# Patient Record
Sex: Female | Born: 1970 | Hispanic: Yes | Marital: Married | Smoking: Never smoker
Health system: Southern US, Community
[De-identification: ages and names within clinical notes are randomized; demographics above are authoritative.]

## PROBLEM LIST (undated history)

## (undated) DIAGNOSIS — D649 Anemia, unspecified: Secondary | ICD-10-CM

## (undated) DIAGNOSIS — O909 Complication of the puerperium, unspecified: Secondary | ICD-10-CM

## (undated) DIAGNOSIS — D259 Leiomyoma of uterus, unspecified: Secondary | ICD-10-CM

## (undated) DIAGNOSIS — R519 Headache, unspecified: Secondary | ICD-10-CM

## (undated) HISTORY — DX: Leiomyoma of uterus, unspecified: D25.9

---
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
## 2008-04-08 DIAGNOSIS — O909 Complication of the puerperium, unspecified: Secondary | ICD-10-CM

## 2008-04-08 HISTORY — DX: Complication of the puerperium, unspecified: O90.9

## 2019-01-01 ENCOUNTER — Telehealth: Payer: Self-pay | Admitting: *Deleted

## 2019-01-01 NOTE — Telephone Encounter (Signed)
Called and left the patient a message to call the office back. Need to schedule the patient for a follow up appt

## 2019-01-04 ENCOUNTER — Telehealth: Payer: Self-pay | Admitting: *Deleted

## 2019-01-04 NOTE — Telephone Encounter (Signed)
Called and left the patient a message to call the office back. Patient needs to be scheduled for a new patient appt  °

## 2019-01-05 ENCOUNTER — Telehealth: Payer: Self-pay | Admitting: *Deleted

## 2019-01-05 NOTE — Telephone Encounter (Signed)
Called and left the patient a message to call the office back. Need to schedule the patient for a new patient appt  

## 2019-01-05 NOTE — Telephone Encounter (Signed)
Patient called back and scheduled appt 

## 2019-01-22 ENCOUNTER — Ambulatory Visit: Payer: Self-pay | Admitting: Gynecologic Oncology

## 2019-01-26 ENCOUNTER — Other Ambulatory Visit: Payer: Self-pay | Admitting: Gynecologic Oncology

## 2019-01-26 ENCOUNTER — Encounter: Payer: Self-pay | Admitting: Gynecologic Oncology

## 2019-01-26 ENCOUNTER — Inpatient Hospital Stay: Payer: PRIVATE HEALTH INSURANCE | Attending: Gynecologic Oncology | Admitting: Gynecologic Oncology

## 2019-01-26 ENCOUNTER — Other Ambulatory Visit: Payer: Self-pay

## 2019-01-26 VITALS — BP 124/88 | HR 77 | Temp 98.7°F | Resp 18 | Ht 62.0 in | Wt 133.0 lb

## 2019-01-26 DIAGNOSIS — R971 Elevated cancer antigen 125 [CA 125]: Secondary | ICD-10-CM | POA: Diagnosis not present

## 2019-01-26 DIAGNOSIS — D259 Leiomyoma of uterus, unspecified: Secondary | ICD-10-CM | POA: Insufficient documentation

## 2019-01-26 DIAGNOSIS — N921 Excessive and frequent menstruation with irregular cycle: Secondary | ICD-10-CM | POA: Insufficient documentation

## 2019-01-26 DIAGNOSIS — N83201 Unspecified ovarian cyst, right side: Secondary | ICD-10-CM

## 2019-01-26 DIAGNOSIS — D219 Benign neoplasm of connective and other soft tissue, unspecified: Secondary | ICD-10-CM | POA: Insufficient documentation

## 2019-01-26 MED ORDER — IBUPROFEN 800 MG PO TABS: 800.0000 mg | ORAL_TABLET | Freq: Three times a day (TID) | ORAL | 0 refills | Status: DC | PRN

## 2019-01-26 MED ORDER — SENNOSIDES-DOCUSATE SODIUM 8.6-50 MG PO TABS: 2.0000 | ORAL_TABLET | Freq: Every day | ORAL | 1 refills | Status: DC

## 2019-01-26 MED ORDER — OXYCODONE HCL 5 MG PO TABS: 5.0000 mg | ORAL_TABLET | ORAL | 0 refills | Status: DC | PRN

## 2019-01-26 NOTE — H&P (View-Only) (Signed)
Consult Note: Gyn-Onc  Consult was requested by Dr. Ashok Croon for the evaluation of Tiffany Lambert 48 y.o. female  CC:  Chief Complaint  Patient presents with  . Right ovarian cyst  . Fibroids    Assessment/Plan:  Tiffany Lambert  is a 48 y.o.  year old with a symptomatic fibroid uterus, abnormal uterine bleeding, right ovarian cysts and elevated CA 125.   I discussed with the patient that I am recommending surgery with a robotic assisted total hysterectomy and right salpingo-oophorectomy.  Frozen section will be performed if malignancy is identified a left salpingo-oophorectomy and lymphadenectomy with staging will be performed as deemed appropriate based on pathologic findings.  If pathology is benign we will attempt to preserve her left ovary given her premenopausal age.  Before surgery we will obtain a CT scan of the abdomen and pelvis to evaluate for occult extraovarian disease which would potentially necessitate a laparotomy approach for debulking.  I counseled the patient regarding the risks of surgery including  bleeding, infection, damage to internal organs (such as bladder,ureters, bowels), blood clot, reoperation and rehospitalization. I discussed anticipated postoperative recovery and hospital stay.  I feel that she is a good candidate for outpatient surgery.  HPI: Tiffany Lambert is a 48 year old P2 who is seen in consultation at the request of Dr Ashok Croon for evaluation of abnormal uterine bleeding, fibroid uterus, right ovarian cyst, elevated Ca1 25.  The patient reported an episode of abnormal uterine bleeding in 2017 when she was living in [place].  She was seen by healthcare provider at that time was started on oral contraceptive pills which helped her bleeding and she remained without issues until June 2020.  Between June and September 2020 she developed heavy menorrhagia with mental menorrhagia.  She did not require blood transfusion.  The patient was seen and evaluated with a  transvaginal ultrasound scan on December 16, 2018 which revealed a uterus measuring 8.8 x 9.6 x 7.4 cm with multiple small fibroids and findings consistent with adenomyosis and an endometrial thickness of 4.4 mm.  The left ovary was normal and measured 3 x 2 x 2 cm the right ovary measured 5.4 x 6.6 x 5.2 cm and contained 2 simple cyst with color noted.  There is a small amount of free fluid in the cul-de-sac.  A Ca1 25 was drawn as part of a Roma score on December 18, 2018.  The Ca1 25 was elevated at 464.  The HD4 was within normal limits at 45.4.  The premenopausal Roma score was normal at 0.73 which was considered low likelihood of finding malignancy on surgery.  An endometrial biopsy was performed on December 28, 2018 which revealed benign atrophic endometrium and abundant blood.  The patient was started on progesterone which stopped her abnormal bleeding.  The patient is otherwise a healthy woman.  She has had 1 prior vaginal delivery 1 prior cesarean section.  Her only prior abdominal surgery was a cesarean section.  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  She has no family history for malignancy.  She emigrated from [place].  Current Meds:  Outpatient Encounter Medications as of 01/26/2019  Medication Sig  . ferrous sulfate (FEROSUL) 325 (65 FE) MG tablet Take 325 mg by mouth daily with breakfast.  . Multiple Vitamin (MULTIVITAMIN) tablet Take 1 tablet by mouth daily.  Marland Kitchen omega-3 acid ethyl esters (LOVAZA) 1 g capsule Take by mouth 2 (two) times daily.  Marland Kitchen  Turmeric (QC TUMERIC COMPLEX PO) Take by mouth.  . [DISCONTINUED] medroxyPROGESTERone (PROVERA) 10 MG tablet Take 10 mg by mouth daily.  . [DISCONTINUED] norgestimate-ethinyl estradiol (ORTHO-CYCLEN) 0.25-35 MG-MCG tablet Take 1 tablet by mouth daily.   No facility-administered encounter medications on file as of 01/26/2019.     Allergy:  Allergies  Allergen Reactions  . Other Dermatitis     Dairy products    Social Hx:   Social History   Socioeconomic History  . Marital status: Married    Spouse name: Not on file  . Number of children: Not on file  . Years of education: Not on file  . Highest education level: Not on file  Occupational History  . Not on file  Social Needs  . Financial resource strain: Not on file  . Food insecurity    Worry: Not on file    Inability: Not on file  . Transportation needs    Medical: Not on file    Non-medical: Not on file  Tobacco Use  . Smoking status: Never Smoker  . Smokeless tobacco: Never Used  Substance and Sexual Activity  . Alcohol use: Not on file  . Drug use: Not on file  . Sexual activity: Yes  Lifestyle  . Physical activity    Days per week: Not on file    Minutes per session: Not on file  . Stress: Not on file  Relationships  . Social Herbalist on phone: Not on file    Gets together: Not on file    Attends religious service: Not on file    Active member of club or organization: Not on file    Attends meetings of clubs or organizations: Not on file    Relationship status: Not on file  . Intimate partner violence    Fear of current or ex partner: Not on file    Emotionally abused: Not on file    Physically abused: Not on file    Forced sexual activity: Not on file  Other Topics Concern  . Not on file  Social History Narrative  . Not on file    Past Surgical Hx: History reviewed. No pertinent surgical history.  Past Medical Hx:  Past Medical History:  Diagnosis Date  . Cesarean section wound complication 99991111  . Leiomyoma of uterus     Past Gynecological History:  See HPI No LMP recorded.  Family Hx: History reviewed. No pertinent family history.  Review of Systems:  Constitutional  Feels well,    ENT Normal appearing ears and nares bilaterally Skin/Breast  No rash, sores, jaundice, itching, dryness Cardiovascular  No chest pain, shortness of breath, or edema  Pulmonary   No cough or wheeze.  Gastro Intestinal  No nausea, vomitting, or diarrhoea. No bright red blood per rectum, no abdominal pain, change in bowel movement, or constipation.  Genito Urinary  No frequency, urgency, dysuria, + abnormal bleeding Musculo Skeletal  No myalgia, arthralgia, joint swelling or pain  Neurologic  No weakness, numbness, change in gait,  Psychology  No depression, anxiety, insomnia.   Vitals:  Blood pressure 124/88, pulse 77, temperature 98.7 F (37.1 C), temperature source Temporal, resp. rate 18, height 5\' 2"  (1.575 m), weight 133 lb (60.3 kg), SpO2 100 %.  Physical Exam: WD in NAD Neck  Supple NROM, without any enlargements.  Lymph Node Survey No cervical supraclavicular or inguinal adenopathy Cardiovascular  Pulse normal rate, regularity and rhythm. S1 and S2 normal.  Lungs  Clear to auscultation bilateraly, without wheezes/crackles/rhonchi. Good air movement.  Skin  No rash/lesions/breakdown  Psychiatry  Alert and oriented to person, place, and time  Abdomen  Normoactive bowel sounds, abdomen soft, non-tender and thin without evidence of hernia.  Back No CVA tenderness Genito Urinary  Vulva/vagina: Normal external female genitalia.  No lesions. No discharge or bleeding.  Bladder/urethra:  No lesions or masses, well supported bladder  Vagina: normal  Cervix: Normal appearing, no lesions.  Uterus: retroverted, 12cm, mobile, no parametrial involvement or nodularity. Deviated towards left.  Adnexa: no discretely palpable masses. Rectal  Good tone, no masses no cul de sac nodularity.  Extremities  No bilateral cyanosis, clubbing or edema.   [name], MD  01/26/2019, 12:40 PM  Dd: Dr Rich Number, MD

## 2019-01-26 NOTE — Addendum Note (Signed)
Addended by: [name] D on: 01/26/2019 04:50 PM   Modules accepted: Orders

## 2019-01-26 NOTE — Progress Notes (Signed)
Consult Note: Gyn-Onc  Consult was requested by Dr. Ashok Croon for the evaluation of Tiffany Lambert 48 y.o. female  CC:  Chief Complaint  Patient presents with  . Right ovarian cyst  . Fibroids    Assessment/Plan:  Ms. Phylis Schultheiss  is a 48 y.o.  year old with a symptomatic fibroid uterus, abnormal uterine bleeding, right ovarian cysts and elevated CA 125.   I discussed with the patient that I am recommending surgery with a robotic assisted total hysterectomy and right salpingo-oophorectomy.  Frozen section will be performed if malignancy is identified a left salpingo-oophorectomy and lymphadenectomy with staging will be performed as deemed appropriate based on pathologic findings.  If pathology is benign we will attempt to preserve her left ovary given her premenopausal age.  Before surgery we will obtain a CT scan of the abdomen and pelvis to evaluate for occult extraovarian disease which would potentially necessitate a laparotomy approach for debulking.  I counseled the patient regarding the risks of surgery including  bleeding, infection, damage to internal organs (such as bladder,ureters, bowels), blood clot, reoperation and rehospitalization. I discussed anticipated postoperative recovery and hospital stay.  I feel that she is a good candidate for outpatient surgery.  HPI: Ms Schoenecker is a 48 year old P2 who is seen in consultation at the request of Dr Ashok Croon for evaluation of abnormal uterine bleeding, fibroid uterus, right ovarian cyst, elevated Ca1 25.  The patient reported an episode of abnormal uterine bleeding in 2017 when she was living in [place].  She was seen by healthcare provider at that time was started on oral contraceptive pills which helped her bleeding and she remained without issues until June 2020.  Between June and September 2020 she developed heavy menorrhagia with mental menorrhagia.  She did not require blood transfusion.  The patient was seen and evaluated with a  transvaginal ultrasound scan on December 16, 2018 which revealed a uterus measuring 8.8 x 9.6 x 7.4 cm with multiple small fibroids and findings consistent with adenomyosis and an endometrial thickness of 4.4 mm.  The left ovary was normal and measured 3 x 2 x 2 cm the right ovary measured 5.4 x 6.6 x 5.2 cm and contained 2 simple cyst with color noted.  There is a small amount of free fluid in the cul-de-sac.  A Ca1 25 was drawn as part of a Roma score on December 18, 2018.  The Ca1 25 was elevated at 464.  The HD4 was within normal limits at 45.4.  The premenopausal Roma score was normal at 0.73 which was considered low likelihood of finding malignancy on surgery.  An endometrial biopsy was performed on December 28, 2018 which revealed benign atrophic endometrium and abundant blood.  The patient was started on progesterone which stopped her abnormal bleeding.  The patient is otherwise a healthy woman.  She has had 1 prior vaginal delivery 1 prior cesarean section.  Her only prior abdominal surgery was a cesarean section.  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  She has no family history for malignancy.  She emigrated from [place].  Current Meds:  Outpatient Encounter Medications as of 01/26/2019  Medication Sig  . ferrous sulfate (FEROSUL) 325 (65 FE) MG tablet Take 325 mg by mouth daily with breakfast.  . Multiple Vitamin (MULTIVITAMIN) tablet Take 1 tablet by mouth daily.  Marland Kitchen omega-3 acid ethyl esters (LOVAZA) 1 g capsule Take by mouth 2 (two) times daily.  Marland Kitchen  Turmeric (QC TUMERIC COMPLEX PO) Take by mouth.  . [DISCONTINUED] medroxyPROGESTERone (PROVERA) 10 MG tablet Take 10 mg by mouth daily.  . [DISCONTINUED] norgestimate-ethinyl estradiol (ORTHO-CYCLEN) 0.25-35 MG-MCG tablet Take 1 tablet by mouth daily.   No facility-administered encounter medications on file as of 01/26/2019.     Allergy:  Allergies  Allergen Reactions  . Other Dermatitis     Dairy products    Social Hx:   Social History   Socioeconomic History  . Marital status: Married    Spouse name: Not on file  . Number of children: Not on file  . Years of education: Not on file  . Highest education level: Not on file  Occupational History  . Not on file  Social Needs  . Financial resource strain: Not on file  . Food insecurity    Worry: Not on file    Inability: Not on file  . Transportation needs    Medical: Not on file    Non-medical: Not on file  Tobacco Use  . Smoking status: Never Smoker  . Smokeless tobacco: Never Used  Substance and Sexual Activity  . Alcohol use: Not on file  . Drug use: Not on file  . Sexual activity: Yes  Lifestyle  . Physical activity    Days per week: Not on file    Minutes per session: Not on file  . Stress: Not on file  Relationships  . Social Herbalist on phone: Not on file    Gets together: Not on file    Attends religious service: Not on file    Active member of club or organization: Not on file    Attends meetings of clubs or organizations: Not on file    Relationship status: Not on file  . Intimate partner violence    Fear of current or ex partner: Not on file    Emotionally abused: Not on file    Physically abused: Not on file    Forced sexual activity: Not on file  Other Topics Concern  . Not on file  Social History Narrative  . Not on file    Past Surgical Hx: History reviewed. No pertinent surgical history.  Past Medical Hx:  Past Medical History:  Diagnosis Date  . Cesarean section wound complication 99991111  . Leiomyoma of uterus     Past Gynecological History:  See HPI No LMP recorded.  Family Hx: History reviewed. No pertinent family history.  Review of Systems:  Constitutional  Feels well,    ENT Normal appearing ears and nares bilaterally Skin/Breast  No rash, sores, jaundice, itching, dryness Cardiovascular  No chest pain, shortness of breath, or edema  Pulmonary   No cough or wheeze.  Gastro Intestinal  No nausea, vomitting, or diarrhoea. No bright red blood per rectum, no abdominal pain, change in bowel movement, or constipation.  Genito Urinary  No frequency, urgency, dysuria, + abnormal bleeding Musculo Skeletal  No myalgia, arthralgia, joint swelling or pain  Neurologic  No weakness, numbness, change in gait,  Psychology  No depression, anxiety, insomnia.   Vitals:  Blood pressure 124/88, pulse 77, temperature 98.7 F (37.1 C), temperature source Temporal, resp. rate 18, height 5\' 2"  (1.575 m), weight 133 lb (60.3 kg), SpO2 100 %.  Physical Exam: WD in NAD Neck  Supple NROM, without any enlargements.  Lymph Node Survey No cervical supraclavicular or inguinal adenopathy Cardiovascular  Pulse normal rate, regularity and rhythm. S1 and S2 normal.  Lungs  Clear to auscultation bilateraly, without wheezes/crackles/rhonchi. Good air movement.  Skin  No rash/lesions/breakdown  Psychiatry  Alert and oriented to person, place, and time  Abdomen  Normoactive bowel sounds, abdomen soft, non-tender and thin without evidence of hernia.  Back No CVA tenderness Genito Urinary  Vulva/vagina: Normal external female genitalia.  No lesions. No discharge or bleeding.  Bladder/urethra:  No lesions or masses, well supported bladder  Vagina: normal  Cervix: Normal appearing, no lesions.  Uterus: retroverted, 12cm, mobile, no parametrial involvement or nodularity. Deviated towards left.  Adnexa: no discretely palpable masses. Rectal  Good tone, no masses no cul de sac nodularity.  Extremities  No bilateral cyanosis, clubbing or edema.   [name], MD  01/26/2019, 12:40 PM  Dd: Dr Rich Number, MD

## 2019-01-26 NOTE — Patient Instructions (Addendum)
Preparing for your Surgery  Plan for surgery on November 19. 2020  with Dr. Everitt Amber at [facility] will be scheduled for a Robotic-assisted Total Hysterectomy with Right Salpingo-oophorectomy, possible staging, and possible Bilateral Salpingo-oophorectomy .   STOP your Tumeric and and Fish Oil now until after your surgery.   Pre-operative Testing -You will receive a phone call from presurgical testing at [facility] if you have not received a call already to arrange for a pre-operative testing appointment before your surgery.  This appointment normally occurs one to two weeks before your scheduled surgery.   -Bring your insurance card, copy of an advanced directive if applicable, medication list  -At that visit, you will be asked to sign a consent for a possible blood transfusion in case a transfusion becomes necessary during surgery.  The need for a blood transfusion is rare but having consent is a necessary part of your care.     -You should not be taking blood thinners or aspirin at least ten days prior to surgery unless instructed by your surgeon.  -As part of our enhanced surgical recovery pathway, you may be advised to drink a carbohydrate drink the morning of surgery (at least 3 hours before). If you are diabetic, this will be substituted with G2 gatorade in order to prevent elevated glucose levels prior to surgery.  -Do not take supplements such as fish oil (omega 3), red yeast rice, tumeric before your surgery.  Day Before Surgery at [place] will be asked to take in a light diet the day before surgery.  Avoid carbonated beverages.  You will be advised to have nothing to eat or drink after midnight the evening before.    Eat a light diet the day before surgery.  Examples including soups, broths, toast, yogurt, mashed potatoes.  Things to avoid include carbonated beverages (fizzy beverages), raw fruits and raw vegetables, or beans.   If your bowels are  filled with gas, your surgeon will have difficulty visualizing your pelvic organs which increases your surgical risks.  Your role in recovery Your role is to become active as soon as directed by your doctor, while still giving yourself time to heal.  Rest when you feel tired. You will be asked to do the following in order to speed your recovery:  - Cough and breathe deeply. This helps toclear and expand your lungs and can prevent pneumonia.  - Do mild physical activity. Walking or moving your legs help your circulation and body functions return to normal. A staff member will help you when you try to walk and will provide you with simple exercises. Do not try to get up or walk alone the first time. - Actively manage your pain. Managing your pain lets you move in comfort. We will ask you to rate your pain on a scale of zero to 10. It is your responsibility to tell your doctor or nurse where and how much you hurt so your pain can be treated.  Special Considerations -If you are diabetic, you may be placed on insulin after surgery to have closer control over your blood sugars to promote healing and recovery.  This does not mean that you will be discharged on insulin.  If applicable, your oral antidiabetics will be resumed when you are tolerating a solid diet.  -Your final pathology results from surgery should be available around one week after surgery and the results will be relayed to you when available.  -Dr. Lahoma Crocker  is the surgeon that assists your GYN Oncologist with surgery.  If you end up staying the night, the next day after your surgery you will either see Dr. Denman George or Dr. Lahoma Crocker.  -FMLA forms can be faxed to [phone] and please allow 5-7 business days for completion.  Pain Management After Surgery -You have been prescribed your pain medication and bowel regimen medications before surgery so that you can have these available when you are discharged from the hospital.  The pain medication is for use ONLY AFTER surgery and a new prescription will not be given.   -Make sure that you have Tylenol and Ibuprofen at home to use on a regular basis after surgery for pain control. We recommend alternating the medications every hour to six hours since they work differently and are processed in the body differently for pain relief.  -Review the attached handout on narcotic use and their risks and side effects.   Bowel Regimen -You have been prescribed Sennakot-S to take nightly to prevent constipation especially if you are taking the narcotic pain medication intermittently.  It is important to prevent constipation and drink adequate amounts of liquids.     Eat a light diet the day before surgery.  Examples including soups, broths, toast, yogurt, mashed potatoes.  Things to avoid include carbonated beverages (fizzy beverages), raw fruits and raw vegetables, or beans.   If your bowels are filled with gas, your surgeon will have difficulty visualizing your pelvic organs which increases your surgical risks.   Blood Transfusion Information WHAT IS A BLOOD TRANSFUSION? A transfusion is the replacement of blood or some of its parts. Blood is made up of multiple cells which provide different functions.  Red blood cells carry oxygen and are used for blood loss replacement.  White blood cells fight against infection.  Platelets control bleeding.  Plasma helps clot blood.  Other blood products are available for specialized needs, such as hemophilia or other clotting disorders. BEFORE THE TRANSFUSION  Who gives blood for transfusions?   You may be able to donate blood to be used at a later date on yourself (autologous donation).  Relatives can be asked to donate blood. This is generally not any safer than if you have received blood from a stranger. The same precautions are taken to ensure safety when a relative's blood is donated.  Healthy volunteers who are fully  evaluated to make sure their blood is safe. This is blood bank blood. Transfusion therapy is the safest it has ever been in the practice of medicine. Before blood is taken from a donor, a complete history is taken to make sure that person has no history of diseases nor engages in risky social behavior (examples are intravenous drug use or sexual activity with multiple partners). The donor's travel history is screened to minimize risk of transmitting infections, such as malaria. The donated blood is tested for signs of infectious diseases, such as HIV and hepatitis. The blood is then tested to be sure it is compatible with you in order to minimize the chance of a transfusion reaction. If you or a relative donates blood, this is often done in anticipation of surgery and is not appropriate for emergency situations. It takes many days to process the donated blood. RISKS AND COMPLICATIONS Although transfusion therapy is very safe and saves many lives, the main dangers of transfusion include:   Getting an infectious disease.  Developing a transfusion reaction. This is an allergic reaction to something in the blood  you were given. Every precaution is taken to prevent this. The decision to have a blood transfusion has been considered carefully by your caregiver before blood is given. Blood is not given unless the benefits outweigh the risks.    AFTER SURGERY INSTRUCTIONS 11/192020  Return to work: 4-6 weeks if applicable  Activity: 1. Be up and out of the bed during the day.  Take a nap if needed.  You may walk up steps but be careful and use the hand rail.  Stair climbing will tire you more than you think, you may need to stop part way and rest.   2. No lifting or straining for 6 weeks.  3. No driving for 1 week(s).  Do not drive if you are taking narcotic pain medicine.  4. Shower daily.  Use soap and water on your incision and pat dry; don't rub.  No tub baths until cleared by your surgeon.    5. No sexual activity and nothing in the vagina for 2 weeks.  6. You may experience a small amount of clear drainage from your incisions, which is normal.  If the drainage persists or increases, please call the office.  7. Take Tylenol or ibuprofen first for pain and only use STRONG PAIN MEDICATION for severe pain not relieved by the Tylenol or Ibuprofen.  Monitor your Tylenol intake to a max of 4,000 mg a day.  Diet: 1. Low sodium Heart Healthy Diet is recommended.  2. It is safe to use a laxative, such as Miralax or Colace, if you have difficulty moving your bowels. You can take Sennakot at bedtime every evening to keep bowel movements regular and to prevent constipation.    Wound Care: 1. Keep clean and dry.  Shower daily.  Reasons to call the Doctor:  Fever - Oral temperature greater than 100.4 degrees Fahrenheit  Foul-smelling vaginal discharge  Difficulty urinating  Nausea and vomiting  Increased pain at the site of the incision that is unrelieved with pain medicine.  Difficulty breathing with or without chest pain  New calf pain especially if only on one side  Sudden, continuing increased vaginal bleeding with or without clots.   Contacts: For questions or concerns you should contact:  Dr. Everitt Amber at [phone]  [name], NP at [phone]  After Hours: call [phone] and have the GYN Oncologist paged/contacted

## 2019-01-27 ENCOUNTER — Telehealth: Payer: Self-pay | Admitting: *Deleted

## 2019-01-27 NOTE — Telephone Encounter (Signed)
Per Dr. Serita Grit request, fax office note to Dr. Rich Number

## 2019-01-29 ENCOUNTER — Other Ambulatory Visit: Payer: Self-pay

## 2019-01-29 ENCOUNTER — Encounter (HOSPITAL_COMMUNITY): Payer: Self-pay

## 2019-01-29 ENCOUNTER — Ambulatory Visit (HOSPITAL_COMMUNITY)
Admission: RE | Admit: 2019-01-29 | Discharge: 2019-01-29 | Disposition: A | Discharge status: Home / Self Care | Payer: PRIVATE HEALTH INSURANCE | Source: Ambulatory Visit | Attending: Gynecologic Oncology | Admitting: Gynecologic Oncology

## 2019-01-29 DIAGNOSIS — N921 Excessive and frequent menstruation with irregular cycle: Secondary | ICD-10-CM | POA: Diagnosis present

## 2019-01-29 DIAGNOSIS — R971 Elevated cancer antigen 125 [CA 125]: Secondary | ICD-10-CM | POA: Insufficient documentation

## 2019-01-29 DIAGNOSIS — D219 Benign neoplasm of connective and other soft tissue, unspecified: Secondary | ICD-10-CM | POA: Diagnosis present

## 2019-01-29 DIAGNOSIS — N83201 Unspecified ovarian cyst, right side: Secondary | ICD-10-CM | POA: Insufficient documentation

## 2019-01-29 MED ORDER — IOHEXOL 300 MG/ML  SOLN
100.0000 mL | Freq: Once | INTRAMUSCULAR | Status: AC | PRN
  Administered 2019-01-29: 100 mL via INTRAVENOUS

## 2019-01-29 MED ORDER — SODIUM CHLORIDE (PF) 0.9 % IJ SOLN
INTRAMUSCULAR | Status: AC
  Filled 2019-01-29: qty 50

## 2019-02-03 ENCOUNTER — Telehealth: Payer: Self-pay

## 2019-02-03 NOTE — Telephone Encounter (Signed)
vm left for patient to call office.

## 2019-02-04 NOTE — Telephone Encounter (Signed)
Told Ms Arrizon that the CT scan showed no obvious signs of gross metastasis per [name], NP. Pt verbalized understanding.

## 2019-02-18 NOTE — Patient Instructions (Addendum)
DUE TO COVID-19 ONLY ONE VISITOR IS ALLOWED TO COME WITH YOU AND STAY IN THE WAITING ROOM ONLY DURING PRE OP AND PROCEDURE DAY OF SURGERY. THE 1 VISITOR MAY VISIT WITH YOU AFTER SURGERY IN YOUR PRIVATE ROOM DURING VISITING HOURS ONLY!  YOU NEED TO HAVE A COVID 19 TEST ON 02/22/2019 @ 2:20 PM, THIS TEST MUST BE DONE BEFORE SURGERY, COME  Tiffany Lambert, Tiffany Lambert Tiffany Lambert , [id number].  ([place]) ONCE YOUR COVID TEST IS COMPLETED, PLEASE BEGIN THE QUARANTINE INSTRUCTIONS AS OUTLINED IN YOUR HANDOUT.                Tiffany Lambert    Your procedure is scheduled on: Thursday 02/25/2019   Report to [facility] Main Entrance     Report to Short Stay at 5:30am     Call this number if you have problems the morning of surgery [phone]    Remember: Eat a light diet the day before surgery.  Examples including soups, broths, toast, yogurt, mashed potatoes.  Things to avoid include carbonated beverages (fizzy beverages), raw fruits and raw vegetables, or beans.   If your bowels are filled with gas, your surgeon will have difficulty visualizing your pelvic organs which increases your surgical risks.  NO SOLID FOOD AFTER MIDNIGHT THE NIGHT PRIOR TO SURGERY. NOTHING BY MOUTH EXCEPT CLEAR LIQUIDS UNTIL 4:30 AM .  CLEAR LIQUID DIET   Foods Allowed                                                                     Foods Excluded  Coffee and tea, regular and decaf                             liquids that you cannot  Plain Jell-O any favor except red or purple             see through such as: Fruit ices (not with fruit pulp)                                     milk, soups, orange juice  Iced Popsicles                                    All solid food                                    Cranberry, grape and apple juices Sports drinks like Gatorade Lightly seasoned clear broth or consume(fat free) Sugar, honey  syrup   _____________________________________________________________________       Take these medicines the morning of surgery with A SIP OF WATER: NONE   BRUSH YOUR TEETH MORNING OF SURGERY AND RINSE YOUR MOUTH OUT, NO CHEWING GUM CANDY OR MINTS.                                 You may not have any  metal on your body including hair pins and              piercings     Do not wear jewelry, make-up, lotions, powders or perfumes, deodorant              Do not wear nail polish on your fingernails.  Do not shave  48 hours prior to surgery.                Do not bring valuables to the hospital. Tiffany Lambert.  Contacts, dentures or bridgework may not be worn into surgery.     Patients discharged the day of surgery will not be allowed to drive home. IF YOU ARE HAVING SURGERY AND GOING HOME THE SAME DAY, YOU MUST HAVE AN ADULT TO DRIVE YOU HOME AND BE WITH YOU FOR 24 HOURS. YOU MAY GO HOME BY TAXI OR UBER OR ORTHERWISE, BUT AN ADULT MUST ACCOMPANY YOU HOME AND STAY WITH YOU FOR 24 HOURS.  Name and phone number of your driver: Tiffany Lambert [phone]                Please read over the following fact sheets you were given: _____________________________________________________________________             [facility] - Preparing for Surgery Before surgery, you can play an important role.  Because skin is not sterile, your skin needs to be as free of germs as possible.  You can reduce the number of germs on your skin by washing with CHG (chlorahexidine gluconate) soap before surgery.  CHG is an antiseptic cleaner which kills germs and bonds with the skin to continue killing germs even after washing. Please DO NOT use if you have an allergy to CHG or antibacterial soaps.  If your skin becomes reddened/irritated stop using the CHG and inform your nurse when you arrive at Short Stay. Do not shave (including legs and underarms) for at least 48  hours prior to the first CHG shower.  You may shave your face/neck. Please follow these instructions carefully:  1.  Shower with CHG Soap the night before surgery and the  morning of Surgery.  2.  If you choose to wash your hair, wash your hair first as usual with your  normal  shampoo.  3.  After you shampoo, rinse your hair and body thoroughly to remove the  shampoo.                             4.  Use CHG as you would any other liquid soap.  You can apply chg directly  to the skin and wash                       Gently with a scrungie or clean washcloth.  5.  Apply the CHG Soap to your body ONLY FROM THE NECK DOWN.   Do not use on face/ open                           Wound or open sores. Avoid contact with eyes, ears mouth and genitals (private parts).                       Wash face,  Tiffany Lambert, Tiffany Lambert (private  parts) with your normal soap.             6.  Wash thoroughly, paying special attention to the area where your surgery  will be performed.  7.  Thoroughly rinse your body with warm water from the neck down.  8.  DO NOT shower/wash with your normal soap after using and rinsing off  the CHG Soap.                9.  Pat yourself dry with a clean towel.            10.  Wear clean pajamas.            11.  Place clean sheets on your bed the night of your first shower and do not  sleep with pets. Day of Surgery : Do not apply any lotions/deodorants the morning of surgery.  Please wear clean clothes to the hospital/surgery center.  FAILURE TO FOLLOW THESE INSTRUCTIONS MAY RESULT IN THE CANCELLATION OF YOUR SURGERY PATIENT SIGNATURE_________________________________  NURSE SIGNATURE__________________________________  ________________________________________________________________________   Tiffany Lambert  An incentive spirometer is a tool that can help keep your lungs clear and active. This tool measures how well you are filling your lungs with each breath. Taking long deep breaths may  help reverse or decrease the chance of developing breathing (pulmonary) problems (especially infection) following:  A long period of time when you are unable to move or be active. BEFORE THE PROCEDURE   If the spirometer includes an indicator to show your best effort, your nurse or respiratory therapist will set it to a desired goal.  If possible, sit up straight or lean slightly forward. Try not to slouch.  Hold the incentive spirometer in an upright position. INSTRUCTIONS FOR USE  1. Sit on the edge of your bed if possible, or sit up as far as you can in bed or on a chair. 2. Hold the incentive spirometer in an upright position. 3. Breathe out normally. 4. Place the mouthpiece in your mouth and seal your lips tightly around it. 5. Breathe in slowly and as deeply as possible, raising the piston or the ball toward the top of the column. 6. Hold your breath for 3-5 seconds or for as long as possible. Allow the piston or ball to fall to the bottom of the column. 7. Remove the mouthpiece from your mouth and breathe out normally. 8. Rest for a few seconds and repeat Steps 1 through 7 at least 10 times every 1-2 hours when you are awake. Take your time and take a few normal breaths between deep breaths. 9. The spirometer may include an indicator to show your best effort. Use the indicator as a goal to work toward during each repetition. 10. After each set of 10 deep breaths, practice coughing to be sure your lungs are clear. If you have an incision (the cut made at the time of surgery), support your incision when coughing by placing a pillow or rolled up towels firmly against it. Once you are able to get out of bed, walk around indoors and cough well. You may stop using the incentive spirometer when instructed by your caregiver.  RISKS AND COMPLICATIONS  Take your time so you do not get dizzy or light-headed.  If you are in pain, you may need to take or ask for pain medication before doing  incentive spirometry. It is harder to take a deep breath if you are having pain. AFTER USE  Rest  and breathe slowly and easily.  It can be helpful to keep track of a log of your progress. Your caregiver can provide you with a simple table to help with this. If you are using the spirometer at home, follow these instructions: Pillow IF:   You are having difficultly using the spirometer.  You have trouble using the spirometer as often as instructed.  Your pain medication is not giving enough relief while using the spirometer.  You develop fever of 100.5 F (38.1 C) or higher. SEEK IMMEDIATE MEDICAL CARE IF:   You cough up bloody sputum that had not been present before.  You develop fever of 102 F (38.9 C) or greater.  You develop worsening pain at or near the incision site. MAKE SURE YOU:   Understand these instructions.  Will watch your condition.  Will get help right away if you are not doing well or get worse. Document Released: 08/05/2006 Document Revised: 06/17/2011 Document Reviewed: 10/06/2006 Exit[employer] Patient Information 2014 [employer], [place].   ________________________________________________________________________  WHAT IS A BLOOD TRANSFUSION? Blood Transfusion Information  A transfusion is the replacement of blood or some of its parts. Blood is made up of multiple cells which provide different functions.  Red blood cells carry oxygen and are used for blood loss replacement.  White blood cells fight against infection.  Platelets control bleeding.  Plasma helps clot blood.  Other blood products are available for specialized needs, such as hemophilia or other clotting disorders. BEFORE THE TRANSFUSION  Who gives blood for transfusions?   Healthy volunteers who are fully evaluated to make sure their blood is safe. This is blood bank blood. Transfusion therapy is the safest it has ever been in the practice of medicine. Before blood is taken from a  donor, a complete history is taken to make sure that person has no history of diseases nor engages in risky social behavior (examples are intravenous drug use or sexual activity with multiple partners). The donor's travel history is screened to minimize risk of transmitting infections, such as malaria. The donated blood is tested for signs of infectious diseases, such as HIV and hepatitis. The blood is then tested to be sure it is compatible with you in order to minimize the chance of a transfusion reaction. If you or a relative donates blood, this is often done in anticipation of surgery and is not appropriate for emergency situations. It takes many days to process the donated blood. RISKS AND COMPLICATIONS Although transfusion therapy is very safe and saves many lives, the main dangers of transfusion include:   Getting an infectious disease.  Developing a transfusion reaction. This is an allergic reaction to something in the blood you were given. Every precaution is taken to prevent this. The decision to have a blood transfusion has been considered carefully by your caregiver before blood is given. Blood is not given unless the benefits outweigh the risks. AFTER THE TRANSFUSION  Right after receiving a blood transfusion, you will usually feel much better and more energetic. This is especially true if your red blood cells have gotten low (anemic). The transfusion raises the level of the red blood cells which carry oxygen, and this usually causes an energy increase.  The nurse administering the transfusion will monitor you carefully for complications. HOME CARE INSTRUCTIONS  No special instructions are needed after a transfusion. You may find your energy is better. Speak with your caregiver about any limitations on activity for underlying diseases you may have. [place]  CARE IF:   Your condition is not improving after your transfusion.  You develop redness or irritation at the intravenous (IV)  site. SEEK IMMEDIATE MEDICAL CARE IF:  Any of the following symptoms occur over the next 12 hours:  Shaking chills.  You have a temperature by mouth above 102 F (38.9 C), not controlled by medicine.  Chest, back, or muscle pain.  People around you feel you are not acting correctly or are confused.  Shortness of breath or difficulty breathing.  Dizziness and fainting.  You get a rash or develop hives.  You have a decrease in urine output.  Your urine turns a dark color or changes to pink, red, or brown. Any of the following symptoms occur over the next 10 days:  You have a temperature by mouth above 102 F (38.9 C), not controlled by medicine.  Shortness of breath.  Weakness after normal activity.  The white part of the eye turns yellow (jaundice).  You have a decrease in the amount of urine or are urinating less often.  Your urine turns a dark color or changes to pink, red, or brown. Document Released: 03/22/2000 Document Revised: 06/17/2011 Document Reviewed: 11/09/2007 [facility] Patient Information 2014 [place], [place].  _______________________________________________________________________

## 2019-02-22 ENCOUNTER — Encounter (HOSPITAL_COMMUNITY): Payer: Self-pay

## 2019-02-22 ENCOUNTER — Other Ambulatory Visit (HOSPITAL_COMMUNITY)
Admission: RE | Admit: 2019-02-22 | Discharge: 2019-02-22 | Disposition: A | Discharge status: Home / Self Care | Payer: PRIVATE HEALTH INSURANCE | Source: Ambulatory Visit | Attending: Gynecologic Oncology | Admitting: Gynecologic Oncology

## 2019-02-22 ENCOUNTER — Encounter (HOSPITAL_COMMUNITY)
Admission: RE | Admit: 2019-02-22 | Discharge: 2019-02-22 | Disposition: A | Discharge status: Home / Self Care | Payer: PRIVATE HEALTH INSURANCE | Source: Ambulatory Visit | Attending: Gynecologic Oncology | Admitting: Gynecologic Oncology

## 2019-02-22 ENCOUNTER — Other Ambulatory Visit: Payer: Self-pay

## 2019-02-22 DIAGNOSIS — D259 Leiomyoma of uterus, unspecified: Secondary | ICD-10-CM | POA: Insufficient documentation

## 2019-02-22 DIAGNOSIS — N83201 Unspecified ovarian cyst, right side: Secondary | ICD-10-CM | POA: Diagnosis not present

## 2019-02-22 DIAGNOSIS — Z20828 Contact with and (suspected) exposure to other viral communicable diseases: Secondary | ICD-10-CM | POA: Insufficient documentation

## 2019-02-22 DIAGNOSIS — Z01812 Encounter for preprocedural laboratory examination: Secondary | ICD-10-CM | POA: Insufficient documentation

## 2019-02-22 DIAGNOSIS — R970 Elevated carcinoembryonic antigen [CEA]: Secondary | ICD-10-CM | POA: Insufficient documentation

## 2019-02-22 HISTORY — DX: Headache, unspecified: R51.9

## 2019-02-22 HISTORY — DX: Anemia, unspecified: D64.9

## 2019-02-22 LAB — COMPREHENSIVE METABOLIC PANEL
ALT: 19 U/L (ref 0–44)
AST: 22 U/L (ref 15–41)
Albumin: 4.3 g/dL (ref 3.5–5.0)
Alkaline Phosphatase: 62 U/L (ref 38–126)
Anion gap: 6 (ref 5–15)
BUN: 20 mg/dL (ref 6–20)
CO2: 28 mmol/L (ref 22–32)
Calcium: 9.6 mg/dL (ref 8.9–10.3)
Chloride: 102 mmol/L (ref 98–111)
Creatinine, Ser: 0.73 mg/dL (ref 0.44–1.00)
GFR calc Af Amer: >60 mL/min (ref >60)
GFR calc non Af Amer: >60 mL/min (ref >60)
Glucose, Bld: 78 mg/dL (ref 70–99)
Potassium: 4 mmol/L (ref 3.5–5.1)
Sodium: 136 mmol/L (ref 135–145)
Total Bilirubin: 0.5 mg/dL (ref 0.3–1.2)
Total Protein: 7.7 g/dL (ref 6.5–8.1)

## 2019-02-22 LAB — CBC
HCT: 44.4 % (ref 36.0–46.0)
Hemoglobin: 14.7 g/dL (ref 12.0–15.0)
MCH: 27.9 pg (ref 26.0–34.0)
MCHC: 33.1 g/dL (ref 30.0–36.0)
MCV: 84.3 fL (ref 80.0–100.0)
Platelets: 210 10*3/uL (ref 150–400)
RBC: 5.27 MIL/uL — ABNORMAL HIGH (ref 3.87–5.11)
RDW: 12.5 % (ref 11.5–15.5)
WBC: 4.8 10*3/uL (ref 4.0–10.5)
nRBC: 0 % (ref 0.0–0.2)

## 2019-02-22 LAB — URINALYSIS, ROUTINE W REFLEX MICROSCOPIC
Bilirubin Urine: NEGATIVE
Glucose, UA: NEGATIVE mg/dL
Hgb urine dipstick: NEGATIVE
Ketones, ur: NEGATIVE mg/dL
Leukocytes,Ua: NEGATIVE
Nitrite: NEGATIVE
Protein, ur: NEGATIVE mg/dL
Specific Gravity, Urine: 1.014 (ref 1.005–1.030)
pH: 7 (ref 5.0–8.0)

## 2019-02-23 LAB — CA 125: Cancer Antigen (CA) 125: 22.9 U/mL (ref 0.0–38.1)

## 2019-02-23 LAB — ABO/RH: ABO/RH(D): O POS

## 2019-02-24 ENCOUNTER — Encounter (HOSPITAL_COMMUNITY): Payer: Self-pay | Admitting: Anesthesiology

## 2019-02-24 ENCOUNTER — Telehealth: Payer: Self-pay

## 2019-02-24 LAB — NOVEL CORONAVIRUS, NAA (HOSP ORDER, SEND-OUT TO REF LAB; TAT 18-24 HRS): SARS-CoV-2, NAA: NOT DETECTED

## 2019-02-24 NOTE — Telephone Encounter (Signed)
vm left for Tiffany Lambert, to call office if any questions or concerns regarding upcoming surgery.

## 2019-02-24 NOTE — Anesthesia Preprocedure Evaluation (Addendum)
Anesthesia Evaluation  Patient identified by MRN, date of birth, ID band Patient awake    Reviewed: Allergy & Precautions, NPO status , Patient's Chart, lab work & pertinent test results  Airway Mallampati: III  TM Distance: >3 FB Neck ROM: Full    Dental  (+) Teeth Intact, Dental Advisory Given   Pulmonary    breath sounds clear to auscultation       Cardiovascular negative cardio ROS   Rhythm:Regular Rate:Normal     Neuro/Psych  Headaches, negative psych ROS   GI/Hepatic negative GI ROS, Neg liver ROS,   Endo/Other  negative endocrine ROS  Renal/GU negative Renal ROS     Musculoskeletal negative musculoskeletal ROS (+)   Abdominal Normal abdominal exam  (+)   Peds  Hematology negative hematology ROS (+)   Anesthesia Other Findings   Reproductive/Obstetrics                            Anesthesia Physical Anesthesia Plan  ASA: II  Anesthesia Plan: General   Post-op Pain Management:    Induction: Intravenous  PONV Risk Score and Plan: 4 or greater and Ondansetron, Dexamethasone, Midazolam and Scopolamine patch - Pre-op  Airway Management Planned: Oral ETT  Additional Equipment: None  Intra-op Plan:   Post-operative Plan: Extubation in OR  Informed Consent: I have reviewed the patients History and Physical, chart, labs and discussed the procedure including the risks, benefits and alternatives for the proposed anesthesia with the patient or authorized representative who has indicated his/her understanding and acceptance.     Dental advisory given  Plan Discussed with: CRNA  Anesthesia Plan Comments:        Anesthesia Quick Evaluation

## 2019-02-25 ENCOUNTER — Encounter (HOSPITAL_COMMUNITY)
Admission: RE | Disposition: A | Discharge status: Home / Self Care | Payer: Self-pay | Source: Other Acute Inpatient Hospital | Attending: Gynecologic Oncology

## 2019-02-25 ENCOUNTER — Ambulatory Visit (HOSPITAL_COMMUNITY)
Admission: RE | Admit: 2019-02-25 | Discharge: 2019-02-25 | Disposition: A | Discharge status: Home / Self Care | Payer: PRIVATE HEALTH INSURANCE | Source: Other Acute Inpatient Hospital | Attending: Gynecologic Oncology | Admitting: Gynecologic Oncology

## 2019-02-25 ENCOUNTER — Encounter (HOSPITAL_COMMUNITY): Payer: Self-pay

## 2019-02-25 ENCOUNTER — Ambulatory Visit (HOSPITAL_COMMUNITY): Payer: PRIVATE HEALTH INSURANCE | Admitting: Physician Assistant

## 2019-02-25 ENCOUNTER — Ambulatory Visit (HOSPITAL_COMMUNITY): Payer: PRIVATE HEALTH INSURANCE | Admitting: Anesthesiology

## 2019-02-25 DIAGNOSIS — N83291 Other ovarian cyst, right side: Secondary | ICD-10-CM | POA: Diagnosis not present

## 2019-02-25 DIAGNOSIS — Z793 Long term (current) use of hormonal contraceptives: Secondary | ICD-10-CM | POA: Insufficient documentation

## 2019-02-25 DIAGNOSIS — R971 Elevated cancer antigen 125 [CA 125]: Secondary | ICD-10-CM | POA: Diagnosis present

## 2019-02-25 DIAGNOSIS — N83201 Unspecified ovarian cyst, right side: Secondary | ICD-10-CM | POA: Diagnosis present

## 2019-02-25 DIAGNOSIS — Z79899 Other long term (current) drug therapy: Secondary | ICD-10-CM | POA: Insufficient documentation

## 2019-02-25 DIAGNOSIS — R8569 Abnormal cytological findings in specimens from other digestive organs and abdominal cavity: Secondary | ICD-10-CM | POA: Insufficient documentation

## 2019-02-25 DIAGNOSIS — N800 Endometriosis of uterus: Secondary | ICD-10-CM | POA: Diagnosis not present

## 2019-02-25 DIAGNOSIS — N939 Abnormal uterine and vaginal bleeding, unspecified: Secondary | ICD-10-CM | POA: Diagnosis present

## 2019-02-25 DIAGNOSIS — N888 Other specified noninflammatory disorders of cervix uteri: Secondary | ICD-10-CM | POA: Insufficient documentation

## 2019-02-25 DIAGNOSIS — D259 Leiomyoma of uterus, unspecified: Secondary | ICD-10-CM | POA: Diagnosis present

## 2019-02-25 DIAGNOSIS — D219 Benign neoplasm of connective and other soft tissue, unspecified: Secondary | ICD-10-CM | POA: Diagnosis present

## 2019-02-25 DIAGNOSIS — N920 Excessive and frequent menstruation with regular cycle: Secondary | ICD-10-CM | POA: Insufficient documentation

## 2019-02-25 DIAGNOSIS — N921 Excessive and frequent menstruation with irregular cycle: Secondary | ICD-10-CM | POA: Diagnosis present

## 2019-02-25 HISTORY — PX: ROBOTIC ASSISTED TOTAL HYSTERECTOMY WITH BILATERAL SALPINGO OOPHERECTOMY: SHX6086

## 2019-02-25 LAB — TYPE AND SCREEN
ABO/RH(D): O POS
Antibody Screen: NEGATIVE

## 2019-02-25 LAB — PREGNANCY, URINE: Preg Test, Ur: NEGATIVE

## 2019-02-25 SURGERY — HYSTERECTOMY, TOTAL, ROBOT-ASSISTED, LAPAROSCOPIC, WITH BILATERAL SALPINGO-OOPHORECTOMY
Anesthesia: General | Laterality: Right

## 2019-02-25 MED ORDER — MEPERIDINE HCL 50 MG/ML IJ SOLN: 6.2500 mg | INTRAMUSCULAR | Status: DC | PRN

## 2019-02-25 MED ORDER — FENTANYL CITRATE (PF) 100 MCG/2ML IJ SOLN: 25.0000 ug | INTRAMUSCULAR | Status: DC | PRN

## 2019-02-25 MED ORDER — ACETAMINOPHEN 325 MG PO TABS: 325.0000 mg | ORAL_TABLET | Freq: Once | ORAL | Status: DC | PRN

## 2019-02-25 MED ORDER — SCOPOLAMINE 1 MG/3DAYS TD PT72
1.0000 | MEDICATED_PATCH | TRANSDERMAL | Status: DC
  Administered 2019-02-25: 06:00:00 1.5 mg via TRANSDERMAL
  Filled 2019-02-25: qty 1

## 2019-02-25 MED ORDER — ACETAMINOPHEN 325 MG PO TABS: 650.0000 mg | ORAL_TABLET | ORAL | Status: DC | PRN

## 2019-02-25 MED ORDER — EPHEDRINE 5 MG/ML INJ
INTRAVENOUS | Status: AC
  Filled 2019-02-25: qty 10

## 2019-02-25 MED ORDER — ACETAMINOPHEN 10 MG/ML IV SOLN: 1000.0000 mg | Freq: Once | INTRAVENOUS | Status: DC | PRN

## 2019-02-25 MED ORDER — MORPHINE SULFATE (PF) 4 MG/ML IV SOLN: 2.0000 mg | INTRAVENOUS | Status: DC | PRN

## 2019-02-25 MED ORDER — SUCCINYLCHOLINE CHLORIDE 200 MG/10ML IV SOSY
PREFILLED_SYRINGE | INTRAVENOUS | Status: AC
  Filled 2019-02-25: qty 10

## 2019-02-25 MED ORDER — ENOXAPARIN SODIUM 40 MG/0.4ML ~~LOC~~ SOLN
40.0000 mg | SUBCUTANEOUS | Status: AC
  Administered 2019-02-25: 40 mg via SUBCUTANEOUS
  Filled 2019-02-25: qty 0.4

## 2019-02-25 MED ORDER — ACETAMINOPHEN 650 MG RE SUPP: 650.0000 mg | RECTAL | Status: DC | PRN

## 2019-02-25 MED ORDER — PHENYLEPHRINE 40 MCG/ML (10ML) SYRINGE FOR IV PUSH (FOR BLOOD PRESSURE SUPPORT)
PREFILLED_SYRINGE | INTRAVENOUS | Status: DC | PRN
  Administered 2019-02-25 (×2): 120 ug via INTRAVENOUS
  Administered 2019-02-25: 40 ug via INTRAVENOUS
  Administered 2019-02-25 (×2): 120 ug via INTRAVENOUS

## 2019-02-25 MED ORDER — DEXAMETHASONE SODIUM PHOSPHATE 10 MG/ML IJ SOLN
INTRAMUSCULAR | Status: DC | PRN
  Administered 2019-02-25: 10 mg via INTRAVENOUS

## 2019-02-25 MED ORDER — SODIUM CHLORIDE 0.9% FLUSH: 3.0000 mL | Freq: Two times a day (BID) | INTRAVENOUS | Status: DC

## 2019-02-25 MED ORDER — LIDOCAINE 2% (20 MG/ML) 5 ML SYRINGE
INTRAMUSCULAR | Status: AC
  Filled 2019-02-25: qty 5

## 2019-02-25 MED ORDER — LACTATED RINGERS IR SOLN
Status: DC | PRN
  Administered 2019-02-25: 1000 mL

## 2019-02-25 MED ORDER — ACETAMINOPHEN 500 MG PO TABS
1000.0000 mg | ORAL_TABLET | ORAL | Status: AC
  Administered 2019-02-25: 06:00:00 1000 mg via ORAL
  Filled 2019-02-25: qty 2

## 2019-02-25 MED ORDER — PROPOFOL 10 MG/ML IV BOLUS
INTRAVENOUS | Status: DC | PRN
  Administered 2019-02-25: 120 mg via INTRAVENOUS

## 2019-02-25 MED ORDER — LACTATED RINGERS IV SOLN
INTRAVENOUS | Status: DC
  Administered 2019-02-25 (×2): via INTRAVENOUS

## 2019-02-25 MED ORDER — PHENYLEPHRINE HCL (PRESSORS) 10 MG/ML IV SOLN
INTRAVENOUS | Status: AC
  Filled 2019-02-25: qty 1

## 2019-02-25 MED ORDER — CEFAZOLIN SODIUM-DEXTROSE 2-4 GM/100ML-% IV SOLN
2.0000 g | INTRAVENOUS | Status: AC
  Administered 2019-02-25: 2 g via INTRAVENOUS
  Filled 2019-02-25: qty 100

## 2019-02-25 MED ORDER — ROCURONIUM BROMIDE 10 MG/ML (PF) SYRINGE
PREFILLED_SYRINGE | INTRAVENOUS | Status: DC | PRN
  Administered 2019-02-25: 70 mg via INTRAVENOUS

## 2019-02-25 MED ORDER — ROCURONIUM BROMIDE 10 MG/ML (PF) SYRINGE
PREFILLED_SYRINGE | INTRAVENOUS | Status: AC
  Filled 2019-02-25: qty 10

## 2019-02-25 MED ORDER — ONDANSETRON HCL 4 MG/2ML IJ SOLN
INTRAMUSCULAR | Status: DC | PRN
  Administered 2019-02-25: 4 mg via INTRAVENOUS

## 2019-02-25 MED ORDER — ONDANSETRON HCL 4 MG/2ML IJ SOLN
INTRAMUSCULAR | Status: AC
  Filled 2019-02-25: qty 2

## 2019-02-25 MED ORDER — MIDAZOLAM HCL 5 MG/5ML IJ SOLN
INTRAMUSCULAR | Status: DC | PRN
  Administered 2019-02-25: 2 mg via INTRAVENOUS

## 2019-02-25 MED ORDER — LACTATED RINGERS IV SOLN: INTRAVENOUS | Status: DC

## 2019-02-25 MED ORDER — STERILE WATER FOR IRRIGATION IR SOLN
Status: DC | PRN
  Administered 2019-02-25: 1000 mL

## 2019-02-25 MED ORDER — OXYCODONE HCL 5 MG PO TABS: 5.0000 mg | ORAL_TABLET | ORAL | Status: DC | PRN

## 2019-02-25 MED ORDER — PROMETHAZINE HCL 25 MG/ML IJ SOLN: 6.2500 mg | INTRAMUSCULAR | Status: DC | PRN

## 2019-02-25 MED ORDER — ACETAMINOPHEN 160 MG/5ML PO SOLN: 325.0000 mg | Freq: Once | ORAL | Status: DC | PRN

## 2019-02-25 MED ORDER — PROPOFOL 10 MG/ML IV BOLUS
INTRAVENOUS | Status: AC
  Filled 2019-02-25: qty 40

## 2019-02-25 MED ORDER — EPHEDRINE SULFATE-NACL 50-0.9 MG/10ML-% IV SOSY
PREFILLED_SYRINGE | INTRAVENOUS | Status: DC | PRN
  Administered 2019-02-25 (×2): 10 mg via INTRAVENOUS
  Administered 2019-02-25: 5 mg via INTRAVENOUS

## 2019-02-25 MED ORDER — GABAPENTIN 300 MG PO CAPS
300.0000 mg | ORAL_CAPSULE | ORAL | Status: AC
  Administered 2019-02-25: 300 mg via ORAL
  Filled 2019-02-25: qty 1

## 2019-02-25 MED ORDER — PHENYLEPHRINE HCL-NACL 10-0.9 MG/250ML-% IV SOLN
INTRAVENOUS | Status: DC | PRN
  Administered 2019-02-25: 15 ug/min via INTRAVENOUS

## 2019-02-25 MED ORDER — LIDOCAINE 2% (20 MG/ML) 5 ML SYRINGE
INTRAMUSCULAR | Status: DC | PRN
  Administered 2019-02-25: 40 mg via INTRAVENOUS

## 2019-02-25 MED ORDER — BUPIVACAINE HCL (PF) 0.25 % IJ SOLN
INTRAMUSCULAR | Status: AC
  Filled 2019-02-25: qty 30

## 2019-02-25 MED ORDER — BUPIVACAINE HCL (PF) 0.25 % IJ SOLN
INTRAMUSCULAR | Status: DC | PRN
  Administered 2019-02-25: 15 mL

## 2019-02-25 MED ORDER — SUGAMMADEX SODIUM 200 MG/2ML IV SOLN
INTRAVENOUS | Status: DC | PRN
  Administered 2019-02-25: 200 mg via INTRAVENOUS

## 2019-02-25 MED ORDER — DEXAMETHASONE SODIUM PHOSPHATE 4 MG/ML IJ SOLN: 4.0000 mg | INTRAMUSCULAR | Status: DC

## 2019-02-25 MED ORDER — SODIUM CHLORIDE 0.9 % IV SOLN: 250.0000 mL | INTRAVENOUS | Status: DC | PRN

## 2019-02-25 MED ORDER — FENTANYL CITRATE (PF) 100 MCG/2ML IJ SOLN
INTRAMUSCULAR | Status: DC | PRN
  Administered 2019-02-25 (×4): 50 ug via INTRAVENOUS

## 2019-02-25 MED ORDER — SODIUM CHLORIDE 0.9% FLUSH: 3.0000 mL | INTRAVENOUS | Status: DC | PRN

## 2019-02-25 MED ORDER — SUCCINYLCHOLINE CHLORIDE 200 MG/10ML IV SOSY
PREFILLED_SYRINGE | INTRAVENOUS | Status: DC | PRN
  Administered 2019-02-25: 120 mg via INTRAVENOUS

## 2019-02-25 MED ORDER — ESMOLOL HCL 100 MG/10ML IV SOLN: INTRAVENOUS | Status: DC | PRN

## 2019-02-25 MED ORDER — LIDOCAINE HCL 2 % IJ SOLN
INTRAMUSCULAR | Status: AC
  Filled 2019-02-25: qty 20

## 2019-02-25 MED ORDER — PHENYLEPHRINE 40 MCG/ML (10ML) SYRINGE FOR IV PUSH (FOR BLOOD PRESSURE SUPPORT)
PREFILLED_SYRINGE | INTRAVENOUS | Status: AC
  Filled 2019-02-25: qty 10

## 2019-02-25 MED ORDER — CELECOXIB 200 MG PO CAPS
400.0000 mg | ORAL_CAPSULE | ORAL | Status: AC
  Administered 2019-02-25: 06:00:00 400 mg via ORAL
  Filled 2019-02-25: qty 2

## 2019-02-25 MED ORDER — MIDAZOLAM HCL 2 MG/2ML IJ SOLN
INTRAMUSCULAR | Status: AC
  Filled 2019-02-25: qty 2

## 2019-02-25 MED ORDER — ESMOLOL HCL 100 MG/10ML IV SOLN
INTRAVENOUS | Status: DC | PRN
  Administered 2019-02-25: 20 mg via INTRAVENOUS

## 2019-02-25 MED ORDER — DEXAMETHASONE SODIUM PHOSPHATE 10 MG/ML IJ SOLN
INTRAMUSCULAR | Status: AC
  Filled 2019-02-25: qty 1

## 2019-02-25 MED ORDER — ESMOLOL HCL 100 MG/10ML IV SOLN
INTRAVENOUS | Status: AC
  Filled 2019-02-25: qty 10

## 2019-02-25 MED ORDER — GLYCOPYRROLATE PF 0.2 MG/ML IJ SOSY
PREFILLED_SYRINGE | INTRAMUSCULAR | Status: AC
  Filled 2019-02-25: qty 1

## 2019-02-25 MED ORDER — LIDOCAINE 20MG/ML (2%) 15 ML SYRINGE OPTIME
INTRAMUSCULAR | Status: DC | PRN
  Administered 2019-02-25: 1.5 mg/kg/h via INTRAVENOUS

## 2019-02-25 MED ORDER — FENTANYL CITRATE (PF) 250 MCG/5ML IJ SOLN
INTRAMUSCULAR | Status: AC
  Filled 2019-02-25: qty 5

## 2019-02-25 SURGICAL SUPPLY — 63 items
APPLICATOR SURGIFLO ENDO (HEMOSTASIS) IMPLANT
BAG LAPAROSCOPIC 12 15 PORT 16 (BASKET) IMPLANT
BAG RETRIEVAL 12/15 (BASKET)
BLADE SURG SZ10 CARB STEEL (BLADE) IMPLANT
COVER BACK TABLE 60X90IN (DRAPES) ×2 IMPLANT
COVER TIP SHEARS 8 DVNC (MISCELLANEOUS) ×1 IMPLANT
COVER TIP SHEARS 8MM DA VINCI (MISCELLANEOUS) ×1
COVER WAND RF STERILE (DRAPES) IMPLANT
DECANTER SPIKE VIAL GLASS SM (MISCELLANEOUS) ×2 IMPLANT
DERMABOND ADVANCED (GAUZE/BANDAGES/DRESSINGS) ×1
DERMABOND ADVANCED .7 DNX12 (GAUZE/BANDAGES/DRESSINGS) ×1 IMPLANT
DRAPE ARM DVNC X/XI (DISPOSABLE) ×4 IMPLANT
DRAPE COLUMN DVNC XI (DISPOSABLE) ×1 IMPLANT
DRAPE DA VINCI XI ARM (DISPOSABLE) ×4
DRAPE DA VINCI XI COLUMN (DISPOSABLE) ×1
DRAPE SHEET LG 3/4 BI-LAMINATE (DRAPES) ×2 IMPLANT
DRAPE SURG IRRIG POUCH 19X23 (DRAPES) ×2 IMPLANT
DRSG OPSITE POSTOP 4X6 (GAUZE/BANDAGES/DRESSINGS) IMPLANT
DRSG OPSITE POSTOP 4X8 (GAUZE/BANDAGES/DRESSINGS) IMPLANT
ELECT REM PT RETURN 15FT ADLT (MISCELLANEOUS) ×2 IMPLANT
GLOVE BIO SURGEON STRL SZ 6 (GLOVE) ×8 IMPLANT
GLOVE BIO SURGEON STRL SZ 6.5 (GLOVE) ×4 IMPLANT
GOWN STRL REUS W/ TWL LRG LVL3 (GOWN DISPOSABLE) ×4 IMPLANT
GOWN STRL REUS W/TWL LRG LVL3 (GOWN DISPOSABLE) ×4
HOLDER FOLEY CATH W/STRAP (MISCELLANEOUS) ×2 IMPLANT
IRRIG SUCT STRYKERFLOW 2 WTIP (MISCELLANEOUS) ×2
IRRIGATION SUCT STRKRFLW 2 WTP (MISCELLANEOUS) ×1 IMPLANT
KIT PROCEDURE DA VINCI SI (MISCELLANEOUS)
KIT PROCEDURE DVNC SI (MISCELLANEOUS) IMPLANT
KIT TURNOVER KIT A (KITS) IMPLANT
MANIPULATOR UTERINE 4.5 ZUMI (MISCELLANEOUS) ×2 IMPLANT
NEEDLE HYPO 22GX1.5 SAFETY (NEEDLE) IMPLANT
NEEDLE SPNL 18GX3.5 QUINCKE PK (NEEDLE) IMPLANT
OBTURATOR OPTICAL STANDARD 8MM (TROCAR) ×1
OBTURATOR OPTICAL STND 8 DVNC (TROCAR) ×1
OBTURATOR OPTICALSTD 8 DVNC (TROCAR) ×1 IMPLANT
PACK ROBOT GYN CUSTOM WL (TRAY / TRAY PROCEDURE) ×2 IMPLANT
PAD POSITIONING PINK XL (MISCELLANEOUS) ×2 IMPLANT
PORT ACCESS TROCAR AIRSEAL 12 (TROCAR) ×1 IMPLANT
PORT ACCESS TROCAR AIRSEAL 5M (TROCAR) ×1
POUCH SPECIMEN RETRIEVAL 10MM (ENDOMECHANICALS) IMPLANT
SEAL CANN UNIV 5-8 DVNC XI (MISCELLANEOUS) ×3 IMPLANT
SEAL XI 5MM-8MM UNIVERSAL (MISCELLANEOUS) ×3
SET TRI-LUMEN FLTR TB AIRSEAL (TUBING) ×2 IMPLANT
SPONGE LAP 18X18 X RAY DECT (DISPOSABLE) IMPLANT
SURGIFLO W/THROMBIN 8M KIT (HEMOSTASIS) IMPLANT
SUT MNCRL AB 4-0 PS2 18 (SUTURE) IMPLANT
SUT PDS AB 1 TP1 96 (SUTURE) IMPLANT
SUT VIC AB 0 CT1 27 (SUTURE)
SUT VIC AB 0 CT1 27XBRD ANTBC (SUTURE) IMPLANT
SUT VIC AB 2-0 CT1 27 (SUTURE)
SUT VIC AB 2-0 CT1 TAPERPNT 27 (SUTURE) IMPLANT
SUT VIC AB 3-0 SH 27 (SUTURE) ×3
SUT VIC AB 3-0 SH 27X BRD (SUTURE) ×3 IMPLANT
SUT VICRYL 4-0 PS2 18IN ABS (SUTURE) ×4 IMPLANT
SYR 10ML LL (SYRINGE) IMPLANT
TOWEL OR NON WOVEN STRL DISP B (DISPOSABLE) ×2 IMPLANT
TRAP SPECIMEN MUCOUS 40CC (MISCELLANEOUS) ×2 IMPLANT
TRAY FOLEY MTR SLVR 16FR STAT (SET/KITS/TRAYS/PACK) ×2 IMPLANT
TROCAR XCEL NON-BLD 5MMX100MML (ENDOMECHANICALS) IMPLANT
UNDERPAD 30X36 HEAVY ABSORB (UNDERPADS AND DIAPERS) ×2 IMPLANT
WATER STERILE IRR 1000ML POUR (IV SOLUTION) ×2 IMPLANT
YANKAUER SUCT BULB TIP 10FT TU (MISCELLANEOUS) IMPLANT

## 2019-02-25 NOTE — Discharge Instructions (Signed)
02/25/2019  Return to work: 4 weeks  Activity: 1. Be up and out of the bed during the day.  Take a nap if needed.  You may walk up steps but be careful and use the hand rail.  Stair climbing will tire you more than you think, you may need to stop part way and rest.   2. No lifting or straining for 6 weeks.  3. No driving for 1 weeks.  Do Not drive if you are taking narcotic pain medicine.  4. Shower daily.  Use soap and water on your incision and pat dry; don't rub.   5. No sexual activity and nothing in the vagina for 8 weeks.  Medications:  - Take ibuprofen and tylenol first line for pain control. Take these regularly (every 6 hours) to decrease the build up of pain.  - If necessary, for severe pain not relieved by ibuprofen, take percocet.  - While taking percocet you should take sennakot every night to reduce the likelihood of constipation. If this causes diarrhea, stop its use.  Diet: 1. Low sodium Heart Healthy Diet is recommended.  2. It is safe to use a laxative if you have difficulty moving your bowels.   Wound Care: 1. Keep clean and dry.  Shower daily.  Reasons to call the Doctor:   Fever - Oral temperature greater than 100.4 degrees Fahrenheit  Foul-smelling vaginal discharge  Difficulty urinating  Nausea and vomiting  Increased pain at the site of the incision that is unrelieved with pain medicine.  Difficulty breathing with or without chest pain  New calf pain especially if only on one side  Sudden, continuing increased vaginal bleeding with or without clots.   Follow-up: 1. See Everitt Amber in 3-4 weeks.  Contacts: For questions or concerns you should contact:  Dr. Everitt Amber at [phone] After hours and on week-ends call [phone] and ask to speak to the physician on call for Gynecologic Oncology  After Your Surgery  The information in this section will tell you what to expect after your surgery, both during your stay and after you  leave. You will learn how to safely recover from your surgery. Write down any questions you have and be sure to ask your doctor or nurse.  What to Expect When you wake up after your surgery, you will be in the [facility] (PACU) or your recovery room. A nurse will be monitoring your body temperature, blood pressure, pulse, and oxygen levels. You may have a urinary catheter in your bladder to help monitor the amount of urine you are making. It should come out before you go home. You will also have compression boots on your lower legs to help your circulation. Your pain medication will be given through an IV line or in tablet form. If you are having pain, tell your nurse. Your nurse will tell you how to recover from your surgery. Below are examples of ways you can help yourself recover safely.  You will be encouraged to walk with the help of your nurse or physical therapist. We will give you medication to relieve pain. Walking helps reduce the risk for blood clots and pneumonia. It also helps to stimulate your bowels so they begin working again.  Use your incentive spirometer. This will help your lungs expand, which prevents pneumonia.   Commonly Asked Questions  Will I have pain after surgery? Yes, you will have some pain after your surgery, especially in the first few days. Your  doctor and nurse will ask you about your pain often. You will be given medication to manage your pain as needed. If your pain is not relieved, please tell your doctor or nurse. It is important to control your pain so you can cough, breathe deeply, use your incentive spirometer, and get out of bed and walk.  Will I be able to eat? Yes, you will be able to eat a regular diet or eat as tolerated. You should start with foods that are soft and easy to digest such as apple sauce and chicken noodle soup. Eat small meals frequently, and then advance to regular foods. If you experience bloating, gas, or cramps,  limit high-fiber foods, including whole grain breads and cereal, nuts, seeds, salads, fresh fruit, broccoli, cabbage, and cauliflower. Will I have pain when I am home? The length of time each person has pain or discomfort varies. You may still have some pain when you go home and will probably be taking pain medication. Follow the guidelines below.  Take your medications as directed and as needed.  Call your doctor if the medication prescribed for you doesn't relieve your pain.  Don't drive or drink alcohol while you're taking prescription pain medication.  As your incision heals, you will have less pain and need less pain medication. A mild pain reliever such as acetaminophen (Tylenol) or ibuprofen (Advil) will relieve aches and discomfort. However, large quantities of acetaminophen may be harmful to your liver. Don't take more acetaminophen than the amount directed on the bottle or as instructed by your doctor or nurse.  Pain medication should help you as you resume your normal activities. Take enough medication to do your exercises comfortably. Pain medication is most effective 30 to 45 minutes after taking it.  Keep track of when you take your pain medication. Taking it when your pain first begins is more effective than waiting for the pain to get worse. Pain medication may cause constipation (having fewer bowel movements than what is normal for you).  How can I prevent constipation?  Go to the bathroom at the same time every day. Your body will get used to going at that time.  If you feel the urge to go, don't put it off. Try to use the bathroom 5 to 15 minutes after meals.  After breakfast is a good time to move your bowels. The reflexes in your colon are strongest at this time.  Exercise, if you can. Walking is an excellent form of exercise.  Drink 8 (8-ounce) glasses (2 liters) of liquids daily, if you can. Drink water, juices, soups, ice cream shakes, and other drinks that don't  have caffeine. Drinks with caffeine, such as coffee and soda, pull fluid out of the body.  Slowly increase the fiber in your diet to 25 to 35 grams per day. Fruits, vegetables, whole grains, and cereals contain fiber. If you have an ostomy or have had recent bowel surgery, check with your doctor or nurse before making any changes in your diet.  Both over-the-counter and prescription medications are available to treat constipation. Start with 1 of the following over-the-counter medications first: o Docusate sodium (Colace) 100 mg. Take ___1__ capsules _2____ times a day. This is a stool softener that causes few side effects. Don't take it with mineral oil. o Polyethylene glycol (MiraLAX) 17 grams daily. o Senna (Senokot) 2 tablets at bedtime. This is a stimulant laxative, which can cause cramping.  If you haven't had a bowel movement in 2 days,  call your doctor or nurse.  Can I shower? Yes, you should shower 24 hours after your surgery. Be sure to shower every day. Taking a warm shower is relaxing and can help decrease muscle aches. Use soap when you shower and gently wash your incision. Pat the areas dry with a towel after showering, and leave your incision uncovered (unless there is drainage). Call your doctor if you see any redness or drainage from your incision. Don't take tub baths until you discuss it with your doctor at the first appointment after your surgery. How do I care for my incisions? You will have several small incisions on your abdomen. The incisions are closed with Steri-Strips or Dermabond. You may also have square white dressings on your incisions (Primapore). You can remove these in the shower 24 hours after your surgery. You should clean your incisions with soap and water. If you go home with Steri-Strips on your incision, they will loosen and may fall off by themselves. If they haven't fallen off within 10 days, you can remove them. If you go home with Dermabond over your  sutures (stitches), it will also loosen and peel off.  What are the most common symptoms after a hysterectomy? It's common for you to have some vaginal spotting or light bleeding. You should monitor this with a pad or a panty liner. If you have having heavy bleeding (bleeding through a pad or liner every 1 to 2 hours), call your doctor right away. It's also common to have some discomfort after surgery from the air that was pumped into your abdomen during surgery. To help with this, walk, drink plenty of liquids and make sure to take the stool softeners you received.  When is it safe for me to drive? You may resume driving 2 weeks after surgery, as long as you are not taking pain medication that may make you drowsy.  When can I resume sexual activity? Do not place anything in your vagina or have vaginal intercourse for 8 weeks after your surgery. Some people will need to wait longer than 8 weeks, so speak with your doctor before resuming sexual intercourse.  Will I be able to travel? Yes, you can travel. If you are traveling by plane within a few weeks after your surgery, make sure you get up and walk every hour. Be sure to stretch your legs, drink plenty of liquids, and keep your feet elevated when possible.  Will I need any supplies? Most people do not need any supplies after the surgery. In the rare case that you do need supplies, such as tubes or drains, your nurse will order them for you.  When can I return to work? The time it takes to return to work depends on the type of work you do, the type of surgery you had, and how fast your body heals. Most people can return to work about 2 to 4 weeks after the surgery.  What exercises can I do? Exercise will help you gain strength and feel better. Walking and stair climbing are excellent forms of exercise. Gradually increase the distance you walk. Climb stairs slowly, resting or stopping as needed. Ask your doctor or nurse before starting more  strenuous exercises.  When can I lift heavy objects? Most people should not lift anything heavier than 10 pounds (4.5 kilograms) for at least 4 weeks after surgery. Speak with your doctor about when you can do heavy lifting.  How can I cope with my feelings? After surgery for  a serious illness, you may have new and upsetting feelings. Many people say they felt weepy, sad, worried, nervous, irritable, and angry at one time or another. You may find that you can't control some of these feelings. If this happens, it's a good idea to seek emotional support. The first step in coping is to talk about how you feel. Family and friends can help. Your nurse, doctor, and social worker can reassure, support, and guide you. It's always a good idea to let these professionals know how you, your family, and your friends are feeling emotionally. Many resources are available to patients and their families. Whether you're in the hospital or at home, the nurses, doctors, and social workers are here to help you and your family and friends handle the emotional aspects of your illness.  When is my first appointment after surgery? Your first appointment after surgery will be 2 to 4 weeks after surgery. Your nurse will give you instructions on how to make this appointment, including the phone number to call.  What if I have other questions? If you have any questions or concerns, please talk with your doctor or nurse. You can reach them Monday through Friday from 9:00 am to 5:00 pm. After 5:00 pm, during the weekend, and on holidays, call [phone] and ask for the doctor on call for your doctor.   Have a temperature of 101 F (38.3 C) or higher  Have pain that does not get better with pain medication  Have redness, drainage, or swelling from your incisions

## 2019-02-25 NOTE — Anesthesia Procedure Notes (Signed)
Procedure Name: Intubation Date/Time: 02/25/2019 7:37 AM Performed by: Silas Sacramento, CRNA Pre-anesthesia Checklist: Patient identified, Emergency Drugs available, Suction available and Patient being monitored Patient Re-evaluated:Patient Re-evaluated prior to induction Oxygen Delivery Method: Circle system utilized Preoxygenation: Pre-oxygenation with 100% oxygen Induction Type: IV induction and Rapid sequence Laryngoscope Size: Mac and 4 Grade View: Grade I Tube type: Oral Number of attempts: 1 Airway Equipment and Method: Stylet Placement Confirmation: ETT inserted through vocal cords under direct vision,  positive ETCO2 and breath sounds checked- equal and bilateral Secured at: 22 cm Tube secured with: Tape Dental Injury: Teeth and Oropharynx as per pre-operative assessment

## 2019-02-25 NOTE — Interval H&P Note (Signed)
History and Physical Interval Note:  02/25/2019 7:07 AM  Tiffany Lambert  has presented today for surgery, with the diagnosis of FIBROIDS UTERUS, RIGHT OVARIAN CYST,ELEVATED CA 125.  The various methods of treatment have been discussed with the patient and family. After consideration of risks, benefits and other options for treatment, the patient has consented to  Procedure(s): XI ROBOTIC ASSISTED TOTAL HYSTERECTOMY WITH RIGHT  SALPINGO OOPHORECTOMY POSSIBLE STAGING, POSSIBLE BILATERAL [place] (Right) as a surgical intervention.  The patient's history has been reviewed, patient examined, no change in status, stable for surgery.  I have reviewed the patient's chart and labs.  Questions were answered to the patient's satisfaction.     [name]

## 2019-02-25 NOTE — Anesthesia Postprocedure Evaluation (Signed)
Anesthesia Post Note  Patient: Ladean Halvorsen  Procedure(s) Performed: XI ROBOTIC ASSISTED TOTAL HYSTERECTOMY WITH BILATERAL SALPINGECTOMY, RIGHT OOPHORECTOMY (Right )     Patient location during evaluation: PACU Anesthesia Type: General Level of consciousness: awake and alert Pain management: pain level controlled Vital Signs Assessment: post-procedure vital signs reviewed and stable Respiratory status: spontaneous breathing, nonlabored ventilation, respiratory function stable and patient connected to nasal cannula oxygen Cardiovascular status: blood pressure returned to baseline and stable Postop Assessment: no apparent nausea or vomiting Anesthetic complications: no    Last Vitals:  Vitals:   02/25/19 1030 02/25/19 1120  BP: 131/66 122/82  Pulse: 99 82  Resp: 19 18  Temp: 37.2 C (!) 35.9 C  SpO2: 100% 100%    Last Pain:  Vitals:   02/25/19 1120  TempSrc: Axillary  PainSc: 0-No pain                 Effie Berkshire

## 2019-02-25 NOTE — Transfer of Care (Signed)
Immediate Anesthesia Transfer of Care Note  Patient: Tiffany Lambert  Procedure(s) Performed: XI ROBOTIC ASSISTED TOTAL HYSTERECTOMY WITH BILATERAL SALPINGECTOMY, RIGHT OOPHORECTOMY (Right )  Patient Location: PACU  Anesthesia Type:General  Level of Consciousness: drowsy, patient cooperative and responds to stimulation  Airway & Oxygen Therapy: Patient Spontanous Breathing and Patient connected to face mask oxygen  Post-op Assessment: Report given to RN and Post -op Vital signs reviewed and stable  Post vital signs: Reviewed and stable  Last Vitals:  Vitals Value Taken Time  BP 132/49 02/25/19 0930  Temp    Pulse 102 02/25/19 0932  Resp 18 02/25/19 0932  SpO2 100 % 02/25/19 0932  Vitals shown include unvalidated device data.  Last Pain:  Vitals:   02/25/19 0557  TempSrc: Oral  PainSc: 0-No pain         Complications: No apparent anesthesia complications

## 2019-02-25 NOTE — Op Note (Signed)
OPERATIVE NOTE 02/25/19  Surgeon: Donaciano Eva   Assistants: Dr Lahoma Crocker (an MD assistant was necessary for tissue manipulation, management of robotic instrumentation, retraction and positioning due to the complexity of the case and hospital policies).   Anesthesia: General endotracheal anesthesia  ASA Class: 2   Pre-operative Diagnosis: fibroids, abnormal uterine bleeding, right ovarian cyst, elevated CA 125  Post-operative Diagnosis: benign ovarian cyst  Operation: Robotic-assisted laparoscopic total hysterectomy with left salpingectomy and right salpingoophorectomy   Surgeon: Donaciano Eva  Assistant Surgeon: Lahoma Crocker MD  Anesthesia: GET  Urine Output: 50  Operative Findings:  : 12cm fibroid uterus, normal appearing left tube and ovary, 6cm cyst on right ovary. Normal appendix and upper abdomen.  Estimated Blood Loss:  less than 50 mL      Total IV Fluids: 800 ml         Specimens: washings, uterus with cervix and left tube and right tube and ovary         Complications:  None; patient tolerated the procedure well.         Disposition: PACU - hemodynamically stable.  Procedure Details  The patient was seen in the Holding Room. The risks, benefits, complications, treatment options, and expected outcomes were discussed with the patient.  The patient concurred with the proposed plan, giving informed consent.  The site of surgery properly noted/marked. The patient was identified as Tiffany Lambert and the procedure verified as a Robotic-assisted hysterectomy with bilateral salpingo oophorectomy. A Time Out was held and the above information confirmed.  After induction of anesthesia, the patient was draped and prepped in the usual sterile manner. Pt was placed in supine position after anesthesia and draped and prepped in the usual sterile manner. The abdominal drape was placed after the CholoraPrep had been allowed to dry for 3 minutes.  Her  arms were tucked to her side with all appropriate precautions.  The shoulders were stabilized with padded shoulder blocks applied to the acromium processes.  The patient was placed in the semi-lithotomy position in [place].  The perineum was prepped with Betadine. The patient was then prepped. Foley catheter was placed.  A sterile speculum was placed in the vagina.  The cervix was grasped with a single-tooth tenaculum and dilated with Kennon Rounds dilators.  The ZUMI uterine manipulator with a medium colpotomizer ring was placed without difficulty.  A pneum occluder balloon was placed over the manipulator.  OG tube placement was confirmed and to suction.   Next, a 5 mm skin incision was made 1 cm below the subcostal margin in the midclavicular line.  The 5 mm Optiview port and scope was used for direct entry.  Opening pressure was under 10 mm CO2.  The abdomen was insufflated and the findings were noted as above.   At this point and all points during the procedure, the patient's intra-abdominal pressure did not exceed 15 mmHg. Next, a 10 mm skin incision was made in the umbilicus and a right and left port was placed about 10 cm lateral to the robot port on the right and left side. All ports were placed under direct visualization.  The patient was placed in steep Trendelenburg.  Bowel was folded away into the upper abdomen.  The robot was docked in the normal manner.  The hysterectomy was started after the round ligament on the right side was incised and the retroperitoneum was entered and the pararectal space was developed.  The ureter was noted to be on  the medial leaf of the broad ligament.  The peritoneum above the ureter was incised and stretched and the infundibulopelvic ligament was skeletonized, cauterized and cut.  The posterior peritoneum was taken down to the level of the KOH ring.  The anterior peritoneum was also taken down.  The bladder flap was created to the level of the KOH ring.  The uterine  artery on the right side was skeletonized, cauterized and cut in the normal manner.  A similar procedure was performed on the left.  The colpotomy was made and the uterus, cervix, bilateral ovaries and tubes were amputated and delivered through the vagina.  Pedicles were inspected and excellent hemostasis was achieved.    The colpotomy at the vaginal cuff was closed with Vicryl on a CT1 needle in a running manner.  Irrigation was used and excellent hemostasis was achieved.  At this point in the procedure was completed.  Robotic instruments were removed under direct visulaization.  The robot was undocked. The 10 mm ports were closed with Vicryl on a UR-5 needle and the fascia was closed with 0 Vicryl on a UR-5 needle.  The skin was closed with 4-0 Vicryl in a subcuticular manner.  Dermabond was applied.  Sponge, lap and needle counts correct x 2.  The patient was taken to the recovery room in stable condition.  The vagina was swabbed with  minimal bleeding noted from the outer vagina where the specimen had passed through. This was made hemostatic with 3-0 vicryl suture.   All instrument and needle counts were correct x  3.   The patient was transferred to the recovery room in a stable condition.  Donaciano Eva, MD

## 2019-02-26 ENCOUNTER — Encounter (HOSPITAL_COMMUNITY): Payer: Self-pay | Admitting: Gynecologic Oncology

## 2019-02-26 ENCOUNTER — Telehealth: Payer: Self-pay

## 2019-02-26 LAB — SURGICAL PATHOLOGY

## 2019-02-26 LAB — CYTOLOGY - NON PAP

## 2019-02-26 NOTE — Telephone Encounter (Signed)
Told Tiffany Lambert that the surgical pathology showed no cancer. It was benign per [name], NP. Pt verbalized understanding.

## 2019-02-26 NOTE — Telephone Encounter (Signed)
Tiffany Lambert states that she is doing well after her surgery.  She is not in pain. She has used the senokot-s as directed lat night. She has not needed Ibuprofen or Oxycodone. Reviewed pain scale with her using a pain scale of 0-10. Zero being no pain and 10 being the worst pain that if her pain level gets up to 5-6/10 that she should take the ibuprofen alt with 500 mg of tylenol.  If her pain with these medications remain 6/10 or goes higher, she could add Oxycodone.  Dressing D&I. She is eating, drinking, and urinating well. Passing gas. She has the office number [phone] if she has any questions or concerns and is aware of her post op appointment on 03-18-19 at 1130.

## 2019-03-18 ENCOUNTER — Encounter: Payer: Self-pay | Admitting: Gynecologic Oncology

## 2019-03-18 ENCOUNTER — Inpatient Hospital Stay: Payer: PRIVATE HEALTH INSURANCE | Attending: Gynecologic Oncology | Admitting: Gynecologic Oncology

## 2019-03-18 ENCOUNTER — Other Ambulatory Visit: Payer: Self-pay

## 2019-03-18 VITALS — BP 112/69 | HR 86 | Temp 97.9°F | Resp 16 | Ht 62.0 in | Wt 130.7 lb

## 2019-03-18 DIAGNOSIS — N83201 Unspecified ovarian cyst, right side: Secondary | ICD-10-CM | POA: Insufficient documentation

## 2019-03-18 DIAGNOSIS — N800 Endometriosis of uterus: Secondary | ICD-10-CM | POA: Insufficient documentation

## 2019-03-18 DIAGNOSIS — D270 Benign neoplasm of right ovary: Secondary | ICD-10-CM | POA: Insufficient documentation

## 2019-03-18 DIAGNOSIS — Z90710 Acquired absence of both cervix and uterus: Secondary | ICD-10-CM | POA: Diagnosis not present

## 2019-03-18 DIAGNOSIS — Z90722 Acquired absence of ovaries, bilateral: Secondary | ICD-10-CM | POA: Insufficient documentation

## 2019-03-18 NOTE — Patient Instructions (Signed)
In 1 week you can resume all activities including lifting. No intercourse for another month.  You do not need pap smears any more as the cervix was removed.  You can resume follow-up with your primary care doctor in [place].

## 2019-03-18 NOTE — Progress Notes (Signed)
Postop Follow-up Note: Gyn-Onc  Consult was requested by Dr. Ashok Croon for the evaluation of Tiffany Lambert 48 y.o. female  CC:  Chief Complaint  Patient presents with  . Left ovarian cyst  . Post-op Follow-up    Assessment/Plan:  Tiffany. Lilac Lambert  is a 48 y.o.  year old with a history of adenomyosis and a right ovarian cyst.  She is status post a robotic assisted total hysterectomy with bilateral salpingectomy and right oophorectomy on February 25, 2019.  She is doing well from surgery with no concerns.  Pathology was benign including adenomyosis and a right ovarian cystadenoma.  She has mild menopausal symptoms that preceded her surgery.  She has a remaining left ovary in situ.  I counseled her about the safety of hormone replacement therapy (estrogen only) if she desires this due to poor quality of life from symptoms.  She is not electing for this at that time.  She will follow up locally in [place] for ongoing wellness care.  HPI: Tiffany Lambert is a 48 year old P2 who is seen in consultation at the request of Dr Ashok Croon for evaluation of abnormal uterine bleeding, fibroid uterus, right ovarian cyst, elevated Ca1 25.  The patient reported an episode of abnormal uterine bleeding in 2017 when she was living in [place].  She was seen by healthcare provider at that time was started on oral contraceptive pills which helped her bleeding and she remained without issues until June 2020.  Between June and September 2020 she developed heavy menorrhagia with mental menorrhagia.  She did not require blood transfusion.  The patient was seen and evaluated with a transvaginal ultrasound scan on December 16, 2018 which revealed a uterus measuring 8.8 x 9.6 x 7.4 cm with multiple small fibroids and findings consistent with adenomyosis and an endometrial thickness of 4.4 mm.  The left ovary was normal and measured 3 x 2 x 2 cm the right ovary measured 5.4 x 6.6 x 5.2 cm and contained 2 simple cyst with  color noted.  There is a small amount of free fluid in the cul-de-sac.  A Ca1 25 was drawn as part of a Roma score on December 18, 2018.  The Ca1 25 was elevated at 464.  The HD4 was within normal limits at 45.4.  The premenopausal Roma score was normal at 0.73 which was considered low likelihood of finding malignancy on surgery.  An endometrial biopsy was performed on December 28, 2018 which revealed benign atrophic endometrium and abundant blood.  The patient was started on progesterone which stopped her abnormal bleeding.  The patient is otherwise a healthy woman.  She has had 1 prior vaginal delivery 1 prior cesarean section.  Her only prior abdominal surgery was a cesarean section.  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  She has no family history for malignancy.  She emigrated from [place].  Interval Hx:  On February 25, 2019 she underwent a robotic assisted total hysterectomy with bilateral salpingectomy and right oophorectomy.  Intraoperative findings were significant for a 12 cm fibroid appearing uterus with normal appearing left tube and ovary, 6 cm cyst on the right ovary, normal appendix and upper abdomen.  Final pathology revealed a right ovarian benign serous cystadenoma, at and adenomyosis of the uterus with no malignancy or hyperplasia.  Since surgery she did well.  She reported no pain postoperatively and took only 1 ibuprofen.  She has no concerns or complaints.  Current Meds:  Outpatient Encounter Medications as of 03/18/2019  Medication Sig  . Multiple Vitamin (MULTIVITAMIN) tablet Take 1 tablet by mouth daily.  . [DISCONTINUED] ferrous sulfate (FEROSUL) 325 (65 FE) MG tablet Take 325 mg by mouth daily with breakfast.  . [DISCONTINUED] ibuprofen (ADVIL) 800 MG tablet Take 1 tablet (800 mg total) by mouth every 8 (eight) hours as needed for moderate pain. For AFTER surgery only  . [DISCONTINUED] oxyCODONE (OXY IR/ROXICODONE) 5 MG  immediate release tablet Take 1 tablet (5 mg total) by mouth every 4 (four) hours as needed for severe pain. For AFTER surgery only, do not take and drive  . [DISCONTINUED] senna-docusate (SENOKOT-S) 8.6-50 MG tablet Take 2 tablets by mouth at bedtime. For AFTER surgery, do not take if having diarrhea   No facility-administered encounter medications on file as of 03/18/2019.    Allergy:  Allergies  Allergen Reactions  . Other Dermatitis    Dairy products    Social Hx:   Social History   Socioeconomic History  . Marital status: Married    Spouse name: Not on file  . Number of children: Not on file  . Years of education: Not on file  . Highest education level: Not on file  Occupational History  . Not on file  Tobacco Use  . Smoking status: Never Smoker  . Smokeless tobacco: Never Used  Substance and Sexual Activity  . Alcohol use: Not Currently  . Drug use: Never  . Sexual activity: Yes  Other Topics Concern  . Not on file  Social History Narrative  . Not on file   Social Determinants of Health   Financial Resource Strain:   . Difficulty of Paying Living Expenses: Not on file  Food Insecurity:   . Worried About Charity fundraiser in the Last Year: Not on file  . Ran Out of Food in the Last Year: Not on file  Transportation Needs:   . Lack of Transportation (Medical): Not on file  . Lack of Transportation (Non-Medical): Not on file  Physical Activity:   . Days of Exercise per Week: Not on file  . Minutes of Exercise per Session: Not on file  Stress:   . Feeling of Stress : Not on file  Social Connections:   . Frequency of Communication with Friends and Family: Not on file  . Frequency of Social Gatherings with Friends and Family: Not on file  . Attends Religious Services: Not on file  . Active Member of Clubs or Organizations: Not on file  . Attends Archivist Meetings: Not on file  . Marital Status: Not on file  Intimate Partner Violence:   . Fear  of Current or Ex-Partner: Not on file  . Emotionally Abused: Not on file  . Physically Abused: Not on file  . Sexually Abused: Not on file    Past Surgical Hx:  Past Surgical History:  Procedure Laterality Date  . CESAREAN SECTION    . ROBOTIC ASSISTED TOTAL HYSTERECTOMY WITH BILATERAL SALPINGO OOPHERECTOMY Right 02/25/2019   Procedure: XI ROBOTIC ASSISTED TOTAL HYSTERECTOMY WITH BILATERAL SALPINGECTOMY, RIGHT OOPHORECTOMY;  Surgeon: Everitt Amber, MD;  Location: [facility];  Service: Gynecology;  Laterality: Right;    Past Medical Hx:  Past Medical History:  Diagnosis Date  . Anemia   . Cesarean section wound complication [id number]  . Headache    Migraines  . Leiomyoma of uterus     Past Gynecological History:  See HPI No LMP recorded. (Menstrual status:  Irregular Periods).  Family Hx: History reviewed. No pertinent family history.  Review of Systems:  Constitutional  Feels well,    ENT Normal appearing ears and nares bilaterally Skin/Breast  No rash, sores, jaundice, itching, dryness Cardiovascular  No chest pain, shortness of breath, or edema  Pulmonary  No cough or wheeze.  Gastro Intestinal  No nausea, vomitting, or diarrhoea. No bright red blood per rectum, no abdominal pain, change in bowel movement, or constipation.  Genito Urinary  No frequency, urgency, dysuria, + abnormal bleeding Musculo Skeletal  No myalgia, arthralgia, joint swelling or pain  Neurologic  No weakness, numbness, change in gait,  Psychology  No depression, anxiety, insomnia.   Vitals:  Blood pressure 112/69, pulse 86, temperature 97.9 F (36.6 C), temperature source Temporal, resp. rate 16, height 5\' 2"  (1.575 m), weight 130 lb 11.2 oz (59.3 kg), SpO2 100 %.  Physical Exam: WD in NAD Neck  Supple NROM, without any enlargements.  Lymph Node Survey No cervical supraclavicular or inguinal adenopathy Cardiovascular  Pulse normal rate, regularity and rhythm. S1 and S2 normal.  Lungs   Clear to auscultation bilateraly, without wheezes/crackles/rhonchi. Good air movement.  Skin  No rash/lesions/breakdown  Psychiatry  Alert and oriented to person, place, and time  Abdomen  Normoactive bowel sounds, abdomen soft, non-tender and thin without evidence of hernia. Well healed incisions.  Back No CVA tenderness Genito Urinary  Vaginal cuff: cuff in tact, no blood, no suture material visible.  Rectal  Good tone, no masses no cul de sac nodularity.  Extremities  No bilateral cyanosis, clubbing or edema.   [name], MD  03/18/2019, 11:51 AM  Dd: Dr Rich Number, MD

## 2020-06-29 IMAGING — CT CT ABD-PELV W/ CM
2 of 5 series · 15 of 46 positions shown, 17 images · IV contrast (OMNIPAQUE)
Comparison: None.

CLINICAL DATA: Pelvic or lower abdominal pain with history of
cystic adnexal lesion and elevated CEA.

EXAM:
CT ABDOMEN AND PELVIS WITH CONTRAST
TECHNIQUE: Multidetector CT imaging of the abdomen and pelvis was performed
using the standard protocol following bolus administration of
intravenous contrast.
CONTRAST:  100mL OMNIPAQUE IOHEXOL 300 MG/ML  SOLN

[Series 2: axial st · axial · 0.65mm/px · z∈[-363,+17]mm · 12 of 88 slices shown, 14 images]
[im 6/88  soft-tissue]
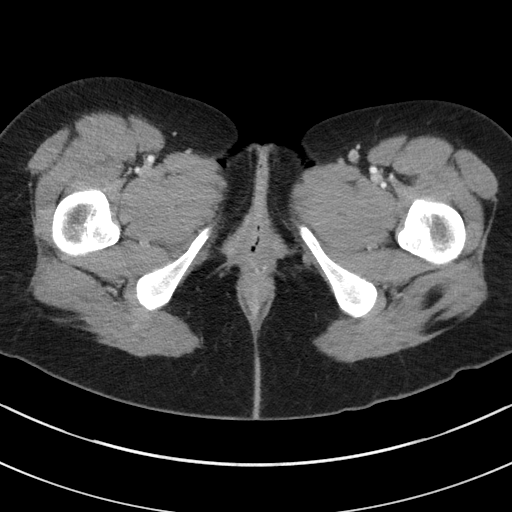
[im 6/88  bone]
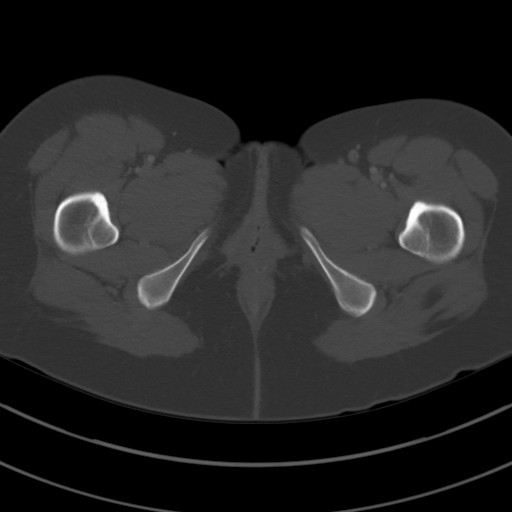
[im 12/88  soft-tissue]
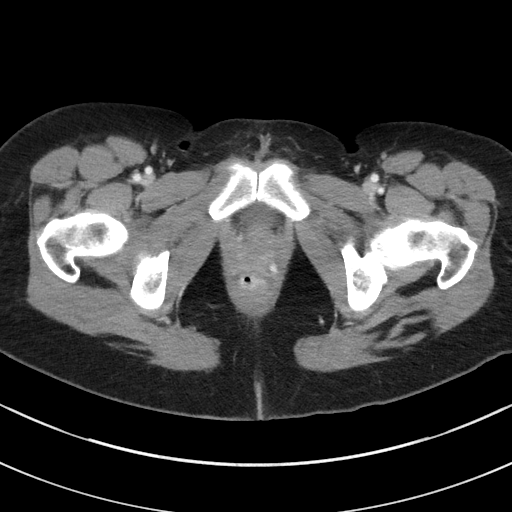
[im 18/88  soft-tissue]
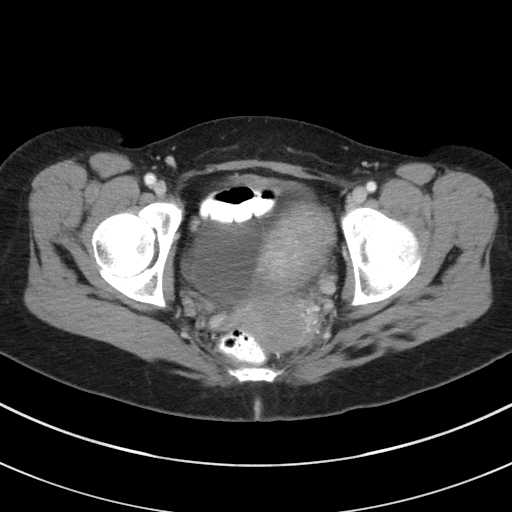
[im 30/88  soft-tissue]
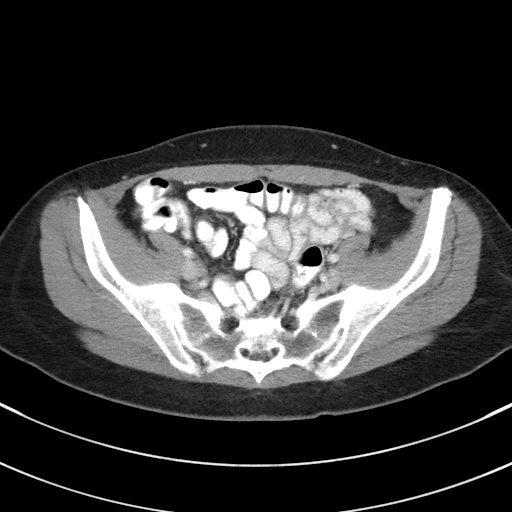
[im 35/88  soft-tissue]
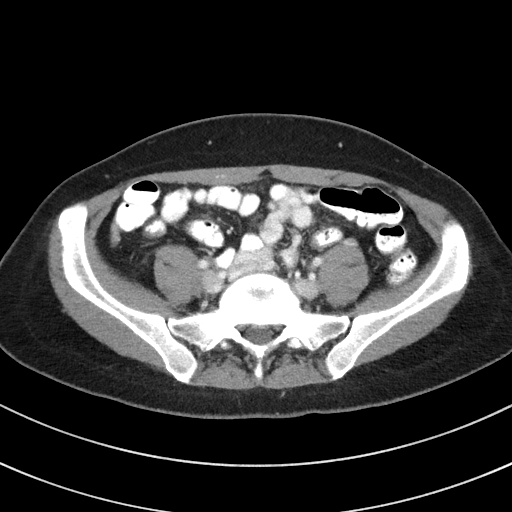
[im 41/88  soft-tissue]
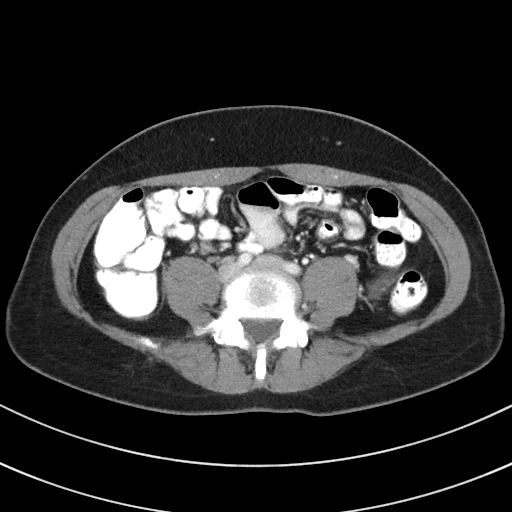
[im 47/88  soft-tissue]
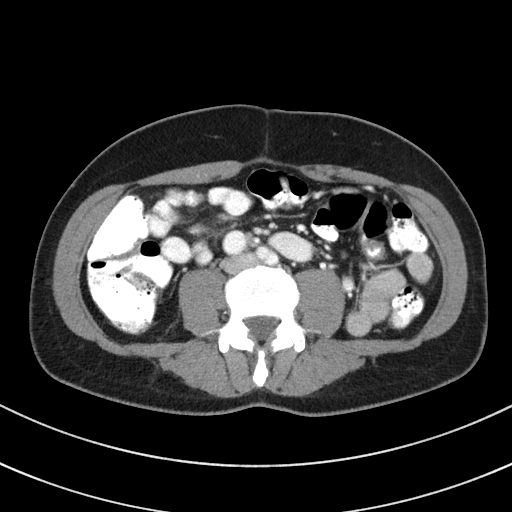
[im 53/88  soft-tissue]
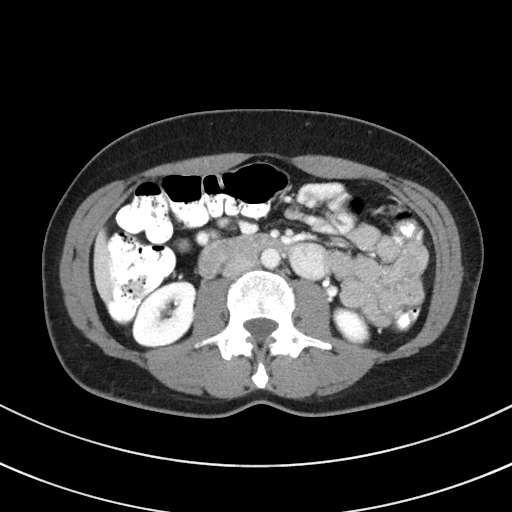
[im 59/88  soft-tissue]
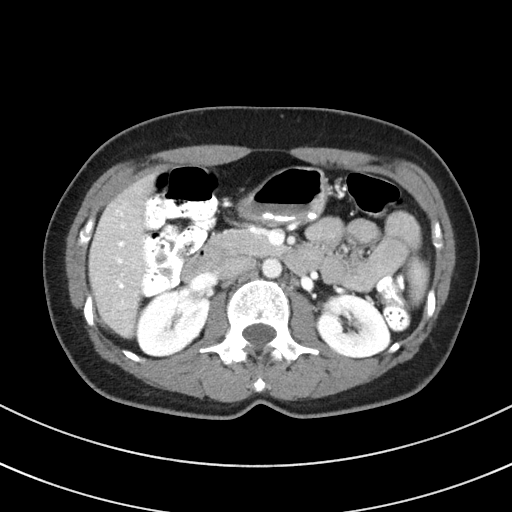
[im 59/88  bone]
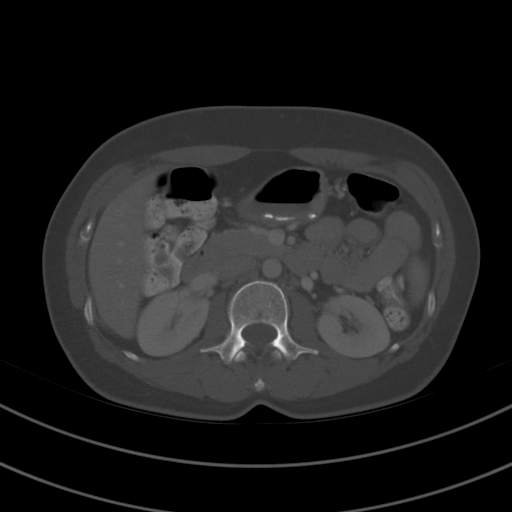
[im 70/88  soft-tissue]
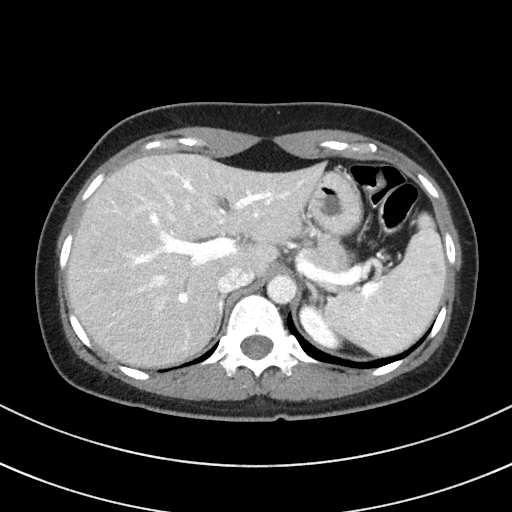
[im 76/88  soft-tissue]
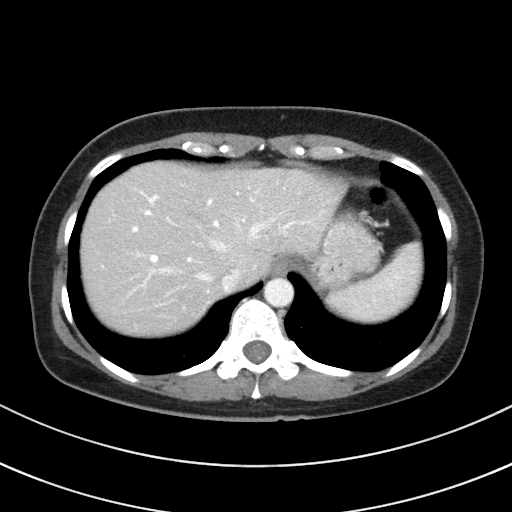
[im 82/88  soft-tissue]
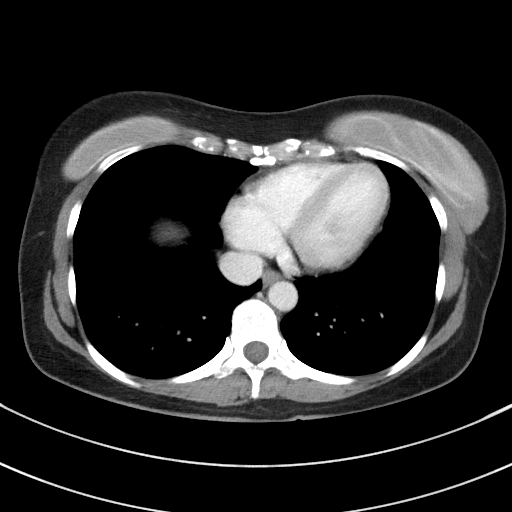

[Series 5: coronal st · coronal · 0.64mm/px · 3 of 78 slices shown]
[im 26/78  soft-tissue]
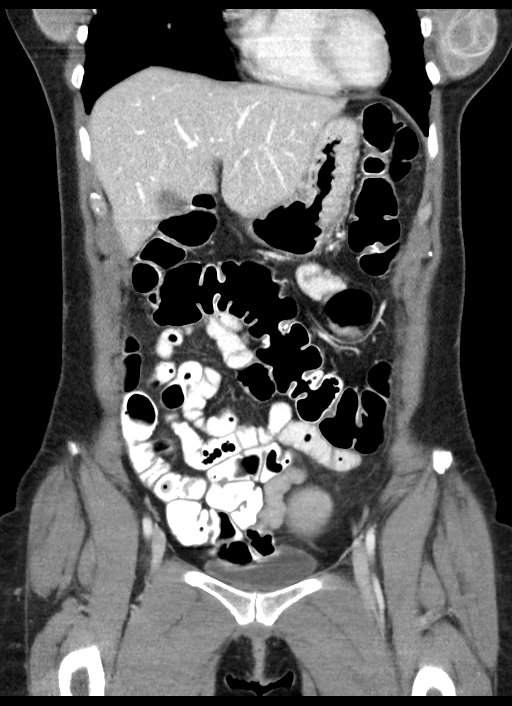
[im 35/78  soft-tissue]
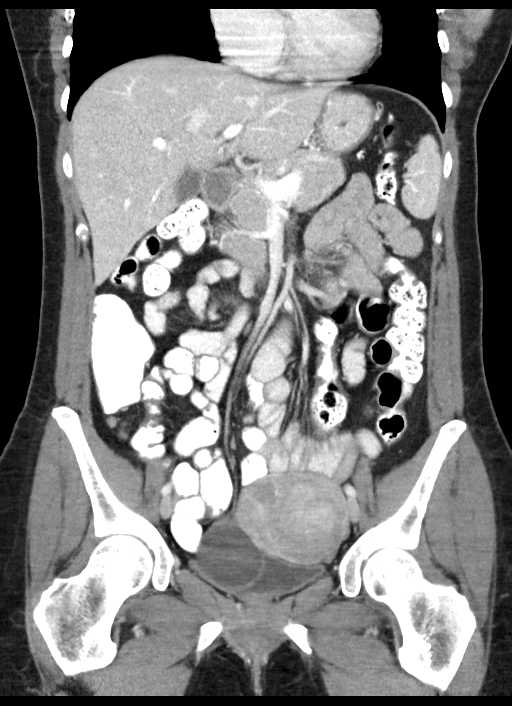
[im 43/78  soft-tissue]
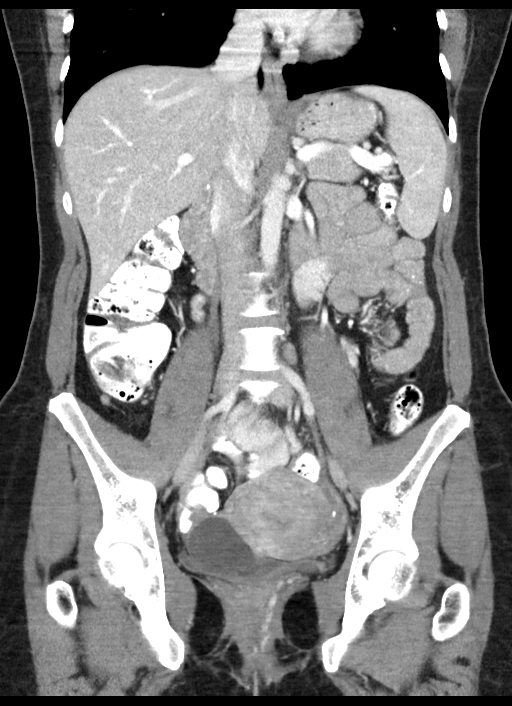

[15 of 46 positions shown; findings below may reference images not displayed]

FINDINGS: Lower chest: No sign of consolidation or evidence of pleural
effusion.

Hepatobiliary: No signs of focal hepatic lesion. Biliary tree is
normal.

Pancreas: No focal lesion or signs of inflammation. No ductal
dilation.

Spleen: Normal size, no focal lesion.

Adrenals/Urinary Tract: Normal adrenal glands. No signs of renal
lesion. No hydronephrosis.

Stomach/Bowel: No sign of acute gastrointestinal process. Appendix
is normal.

Vascular/Lymphatic: Patent abdominal vasculature. No signs of
retroperitoneal adenopathy. No upper abdominal adenopathy.

Reproductive: Cystic right adnexal lesion shows smooth thin walled
appearance. No signs of enhancing mural nodule with suggestion of
septation on coronal view but no measurable enhancement within the
lesion on CT. This area in the right adnexa measures 5.5 x 5.6 x
cm.

The uterus is globular terms of its shape and appearance with more
focal heterogeneity in the fundus posteriorly. Urine size
approximately 10 x 6 by 8 cm.

No signs of pelvic ascites, abdominal ascites or pelvic adenopathy.

Small lymph node along the lateral common iliac chain does not meet
size criteria for pathologic enlargement at 6 mm, represents the
largest lymph nodes seen in the pelvis or lower retroperitoneum.

Other: No ascites. No peritoneal nodularity or mental nodularity.

Musculoskeletal: No acute bone finding or destructive bone process.
IMPRESSION: Cystic lesion in the right adnexa is nonspecific by CT without
overly concerning features, given size and patient age sonographic
follow-up may be helpful or comparison with previous sonographic
images of available.

Globular appearance of the uterus may be due to distortion from
underlying leiomyoma or focal adenomyomatosis but again is
nonspecific. Pelvic sonography may be helpful for further
assessment.

No ascites or adenopathy
